# Patient Record
Sex: Male | Born: 1990 | Race: Black or African American | Hispanic: No | Marital: Single | Smoking: Never smoker
Health system: Southern US, Community
[De-identification: ages and names within clinical notes are randomized; demographics above are authoritative.]

## PROBLEM LIST (undated history)

## (undated) DIAGNOSIS — R569 Unspecified convulsions: Secondary | ICD-10-CM

## (undated) DIAGNOSIS — R45851 Suicidal ideations: Secondary | ICD-10-CM

## (undated) DIAGNOSIS — K521 Toxic gastroenteritis and colitis: Secondary | ICD-10-CM

## (undated) DIAGNOSIS — J45909 Unspecified asthma, uncomplicated: Secondary | ICD-10-CM

## (undated) DIAGNOSIS — F209 Schizophrenia, unspecified: Secondary | ICD-10-CM

## (undated) HISTORY — DX: Toxic gastroenteritis and colitis: K52.1

## (undated) HISTORY — DX: Suicidal ideations: R45.851

---

## 2017-05-15 ENCOUNTER — Encounter (HOSPITAL_COMMUNITY): Payer: Self-pay | Admitting: Emergency Medicine

## 2017-05-15 ENCOUNTER — Emergency Department (HOSPITAL_COMMUNITY)
Admission: EM | Admit: 2017-05-15 | Discharge: 2017-05-15 | Disposition: A | Discharge status: Home / Self Care | Payer: Self-pay | Attending: Emergency Medicine | Admitting: Emergency Medicine

## 2017-05-15 DIAGNOSIS — R110 Nausea: Secondary | ICD-10-CM | POA: Insufficient documentation

## 2017-05-15 DIAGNOSIS — R45851 Suicidal ideations: Secondary | ICD-10-CM | POA: Insufficient documentation

## 2017-05-15 DIAGNOSIS — Z008 Encounter for other general examination: Secondary | ICD-10-CM | POA: Insufficient documentation

## 2017-05-15 DIAGNOSIS — F209 Schizophrenia, unspecified: Secondary | ICD-10-CM | POA: Insufficient documentation

## 2017-05-15 DIAGNOSIS — Z9114 Patient's other noncompliance with medication regimen: Secondary | ICD-10-CM | POA: Insufficient documentation

## 2017-05-15 DIAGNOSIS — R197 Diarrhea, unspecified: Secondary | ICD-10-CM | POA: Insufficient documentation

## 2017-05-15 HISTORY — DX: Schizophrenia, unspecified: F20.9

## 2017-05-15 LAB — CBC WITH DIFFERENTIAL/PLATELET
Basophils Absolute: 0 10*3/uL (ref 0.0–0.1)
Basophils Relative: 0 %
Eosinophils Absolute: 0 10*3/uL (ref 0.0–0.7)
Eosinophils Relative: 0 %
HCT: 44 % (ref 39.0–52.0)
Hemoglobin: 14.7 g/dL (ref 13.0–17.0)
Lymphocytes Relative: 20 %
Lymphs Abs: 1.4 10*3/uL (ref 0.7–4.0)
MCH: 26.5 pg (ref 26.0–34.0)
MCHC: 33.4 g/dL (ref 30.0–36.0)
MCV: 79.4 fL (ref 78.0–100.0)
Monocytes Absolute: 0.7 10*3/uL (ref 0.1–1.0)
Monocytes Relative: 9 %
Neutro Abs: 4.9 10*3/uL (ref 1.7–7.7)
Neutrophils Relative %: 71 %
Platelets: 232 10*3/uL (ref 150–400)
RBC: 5.54 MIL/uL (ref 4.22–5.81)
RDW: 14.2 % (ref 11.5–15.5)
WBC: 7 10*3/uL (ref 4.0–10.5)

## 2017-05-15 LAB — COMPREHENSIVE METABOLIC PANEL
ALT: 30 U/L (ref 17–63)
AST: 21 U/L (ref 15–41)
Albumin: 4.4 g/dL (ref 3.5–5.0)
Alkaline Phosphatase: 74 U/L (ref 38–126)
Anion gap: 9 (ref 5–15)
BUN: 8 mg/dL (ref 6–20)
CO2: 25 mmol/L (ref 22–32)
Calcium: 9.2 mg/dL (ref 8.9–10.3)
Chloride: 106 mmol/L (ref 101–111)
Creatinine, Ser: 0.85 mg/dL (ref 0.61–1.24)
GFR calc Af Amer: >60 mL/min (ref >60)
GFR calc non Af Amer: >60 mL/min (ref >60)
Glucose, Bld: 98 mg/dL (ref 65–99)
Potassium: 3.4 mmol/L — ABNORMAL LOW (ref 3.5–5.1)
Sodium: 140 mmol/L (ref 135–145)
Total Bilirubin: 0.8 mg/dL (ref 0.3–1.2)
Total Protein: 7.8 g/dL (ref 6.5–8.1)

## 2017-05-15 LAB — RAPID URINE DRUG SCREEN, HOSP PERFORMED
Amphetamines: NOT DETECTED
Barbiturates: NOT DETECTED
Benzodiazepines: NOT DETECTED
Cocaine: NOT DETECTED
Opiates: NOT DETECTED
Tetrahydrocannabinol: NOT DETECTED

## 2017-05-15 LAB — TSH: TSH: 1.581 u[IU]/mL (ref 0.350–4.500)

## 2017-05-15 LAB — LITHIUM LEVEL: Lithium Lvl: <0.06 mmol/L — ABNORMAL LOW (ref 0.60–1.20)

## 2017-05-15 LAB — ETHANOL: Alcohol, Ethyl (B): <10 mg/dL (ref <10)

## 2017-05-15 LAB — VALPROIC ACID LEVEL: Valproic Acid Lvl: 30 ug/mL — ABNORMAL LOW (ref 50.0–100.0)

## 2017-05-15 NOTE — ED Provider Notes (Signed)
[facility]-EMERGENCY DEPT Provider Note   CSN: [id number] Arrival date & time: 05/15/17  1427     History   Chief Complaint Chief Complaint  Patient presents with  . IVC    HPI Dylan Flynn is a 27 y.o. male.  HPI  27 year old male with a history of schizophrenia presents [facility].  The patient has been having suicidal thoughts and worsening depression.  He has not been taking his meds for about 1 week.  He is currently on lithium, Depakote, and Clozaril.  [facility] sent him over here for lab work to be faxed back and they would then taken back.  This includes thyroid studies, lithium level, valproic acid level, CBC, CMP.  Patient states he has been having diarrhea starting a couple days ago that has improved.  He has had some abdominal discomfort that he describes as nausea without vomiting.  Currently no abdominal pain.  Is asking for something to drink.  Past Medical History:  Diagnosis Date  . Schizophrenia (HCC)     There are no active problems to display for this patient.     Home Medications    Prior to Admission medications   Not on File    Family History No family history on file.  Social History Social History   Tobacco Use  . Smoking status: Not on file  Substance Use Topics  . Alcohol use: Not on file  . Drug use: Not on file     Allergies   Patient has no allergy information on record.   Review of Systems Review of Systems  Constitutional: Negative for fever.  Gastrointestinal: Positive for diarrhea and nausea. Negative for abdominal pain and vomiting.  Psychiatric/Behavioral: Positive for dysphoric mood and suicidal ideas.  All other systems reviewed and are negative.    Physical Exam Updated Vital Signs BP 127/65 (BP Location: Left Arm)   Pulse 77   Temp 98.8 F (37.1 C) (Oral)   Resp 20   SpO2 100%   Physical Exam  Constitutional: He is oriented to person, place, and time. He  appears well-developed and well-nourished.  HENT:  Head: Normocephalic and atraumatic.  Right Ear: External ear normal.  Left Ear: External ear normal.  Nose: Nose normal.  Eyes: Right eye exhibits no discharge. Left eye exhibits no discharge.  Neck: Neck supple.  Cardiovascular: Normal rate, regular rhythm and normal heart sounds.  Pulmonary/Chest: Effort normal and breath sounds normal.  Abdominal: Soft. He exhibits no distension. There is no tenderness.  Musculoskeletal: He exhibits no edema.  Neurological: He is alert and oriented to person, place, and time.  Skin: Skin is warm and dry.  Psychiatric: His speech is delayed. He expresses suicidal ideation.  Nursing note and vitals reviewed.    ED Treatments / Results  Labs (all labs ordered are listed, but only abnormal results are displayed) Labs Reviewed  COMPREHENSIVE METABOLIC PANEL - Abnormal; Notable for the following components:      Result Value   Potassium 3.4 (*)    All other components within normal limits  LITHIUM LEVEL - Abnormal; Notable for the following components:   Lithium Lvl <0.06 (*)    All other components within normal limits  ETHANOL  RAPID URINE DRUG SCREEN, HOSP PERFORMED  CBC WITH DIFFERENTIAL/PLATELET  TSH  VALPROIC ACID LEVEL    EKG  EKG Interpretation None       Radiology No results found.  Procedures Procedures (including critical care time)  Medications  Ordered in ED Medications - No data to display   Initial Impression / Assessment and Plan / ED Course  I have reviewed the triage vital signs and the nursing notes.  Pertinent labs & imaging results that were available during my care of the patient were reviewed by me and considered in my medical decision making (see chart for details).     Patient's vital signs are unremarkable.  His exam is benign.  He appears stable for psychiatric admission and his labs are currently pending.  Care to Dr. Rubin Payor, transfer back to  [facility] when labs have resulted assuming no significant abnormalities.  Final Clinical Impressions(s) / ED Diagnoses   Final diagnoses:  Medical clearance for psychiatric admission    ED Discharge Orders    None       Pricilla Loveless, MD 05/15/17 [phone]

## 2017-05-15 NOTE — ED Provider Notes (Signed)
  Physical Exam  BP 127/65 (BP Location: Left Arm)   Pulse 77   Temp 98.8 F (37.1 C) (Oral)   Resp 20   SpO2 100%   Physical Exam  ED Course/Procedures     Procedures  MDM  Medical clearance labs done for [facility].  Patient appears to be medically cleared.  Well-appearing.  Discharge in police custody       Benjiman Coreickering, Sharie Amorin, MD 05/15/17 1744

## 2017-05-15 NOTE — ED Triage Notes (Signed)
Per [employer]/[facility]-per [facility]-grandmother took papers out on patient due to noncompliance with psych medication-needs labs for medical clearance and may return to [facility]-states paranoid and hearing voices-denies SI/HI

## 2017-05-15 NOTE — ED Notes (Signed)
[facility]chr[facility] [employer] [facility]

## 2017-07-23 ENCOUNTER — Encounter (HOSPITAL_COMMUNITY): Payer: Self-pay

## 2017-07-23 ENCOUNTER — Other Ambulatory Visit: Payer: Self-pay

## 2017-07-23 ENCOUNTER — Ambulatory Visit (HOSPITAL_COMMUNITY)
Admission: EM | Admit: 2017-07-23 | Discharge: 2017-07-23 | Disposition: A | Discharge status: Home / Self Care | Payer: Self-pay

## 2017-07-23 DIAGNOSIS — L02412 Cutaneous abscess of left axilla: Secondary | ICD-10-CM

## 2017-07-23 MED ORDER — CEPHALEXIN 500 MG PO CAPS: 500.0000 mg | ORAL_CAPSULE | Freq: Four times a day (QID) | ORAL | 0 refills | Status: DC

## 2017-07-23 NOTE — ED Provider Notes (Signed)
07/23/2017 3:30 PM   DOB: 01/01/1991 / MRN: [id number]  SUBJECTIVE:  Lesli AlbeeShon Devaul is a 27 y.o. male presenting for "abscess" under the right arm.  Patient has a history of schizophrenia and is taking clozapine.  He is here with his mother today.  Patient denies fever.  He has No Known Allergies.   He  has a past medical history of Schizophrenia (HCC).    He  reports that he has never smoked. He has never used smokeless tobacco. He reports that he does not drink alcohol or use drugs. He  reports that he does not engage in sexual activity. The patient  has no past surgical history on file.  His family history is not on file.  Review of Systems  Constitutional: Negative for chills, diaphoresis and fever.  Gastrointestinal: Negative for nausea.  Skin: Negative for rash.  Neurological: Negative for dizziness.    OBJECTIVE:  BP 124/75 (BP Location: Left Arm)   Pulse 98   Temp 98.8 F (37.1 C) (Oral)   Resp 17   SpO2 99%   Physical Exam  Constitutional: He appears well-developed. He is active and cooperative.  Non-toxic appearance.  Cardiovascular: Normal rate.  Pulmonary/Chest: Effort normal. No tachypnea.  Neurological: He is alert.  Skin: Skin is warm and dry. He is not diaphoretic. No pallor.     Vitals reviewed.   No results found for this or any previous visit (from the past 72 hour(s)).  No results found.  ASSESSMENT AND PLAN:  No orders of the defined types were placed in this encounter.    Abscess of left axilla: I have gone through the options today.  I think it would probably benefit from incision and drainage however do not think that is absolutely necessary at this time.  Relayed this to the family and they would like to try antibiotics and will come back if at any point this seems to be getting worse.  I have advised copious warm compress.      The patient is advised to call or return to clinic if he does not see an improvement in symptoms, or to seek the  care of the closest emergency department if he worsens with the above plan.   Deliah BostonMichael Ayiana Winslett, MHS, PA-C 07/23/2017 3:30 PM    Ofilia Neaslark, Darriel Utter L, PA-C 07/23/17 1531

## 2017-07-23 NOTE — ED Triage Notes (Signed)
[facility] several months

## 2017-07-23 NOTE — Discharge Instructions (Addendum)
Please place a warm compress on the area often.  This will help it drain faster.  If at any point things seem to be getting worse please come back here for incision and drainage.

## 2017-10-27 ENCOUNTER — Ambulatory Visit (HOSPITAL_COMMUNITY)
Admission: EM | Admit: 2017-10-27 | Discharge: 2017-10-27 | Disposition: A | Discharge status: Home / Self Care | Payer: Self-pay | Attending: Family Medicine | Admitting: Family Medicine

## 2017-10-27 ENCOUNTER — Other Ambulatory Visit: Payer: Self-pay

## 2017-10-27 ENCOUNTER — Encounter (HOSPITAL_COMMUNITY): Payer: Self-pay | Admitting: *Deleted

## 2017-10-27 DIAGNOSIS — L02411 Cutaneous abscess of right axilla: Secondary | ICD-10-CM

## 2017-10-27 MED ORDER — DOXYCYCLINE HYCLATE 100 MG PO CAPS: 100.0000 mg | ORAL_CAPSULE | Freq: Two times a day (BID) | ORAL | 0 refills | Status: DC

## 2017-10-27 NOTE — ED Provider Notes (Signed)
[facility]   [id number] 10/27/17 Arrival Time: 1844  SUBJECTIVE:  Greg Cratty is a 27 y.o. male who presents with a abscess localized to his right axilla that occurred gradually a week ago.  Denies a precipitating event or trauma.  Has tried warm compresses with mild relief.  Reports similar symptoms in the past that improved with antibiotics.  Complains of redness, swelling, and drainage.  Denies fever, chills, nausea, vomiting, SOB, chest pain, abdominal pain, changes in bowel or bladder function.    ROS: As per HPI.  Past Medical History:  Diagnosis Date  . Schizophrenia (HCC)    History reviewed. No pertinent surgical history. Allergies  Allergen Reactions  . Shellfish Allergy    No current facility-administered medications on file prior to encounter.    Current Outpatient Medications on File Prior to Encounter  Medication Sig Dispense Refill  . cloZAPine (CLOZARIL) 100 MG tablet Take 150 mg by mouth 2 (two) times daily.    . Divalproex Sodium (DEPAKOTE PO) Take 10 mLs by mouth daily.    Marland Kitchen lithium 600 MG capsule Take 600 mg by mouth daily.    Marland Kitchen UNABLE TO FIND Med Name: ?Vybrid     Social History   Socioeconomic History  . Marital status: Single    Spouse name: Not on file  . Number of children: Not on file  . Years of education: Not on file  . Highest education level: Not on file  Occupational History  . Not on file  Social Needs  . Financial resource strain: Not on file  . Food insecurity:    Worry: Not on file    Inability: Not on file  . Transportation needs:    Medical: Not on file    Non-medical: Not on file  Tobacco Use  . Smoking status: Never Smoker  . Smokeless tobacco: Never Used  Substance and Sexual Activity  . Alcohol use: Never    Frequency: Never  . Drug use: Never  . Sexual activity: Never  Lifestyle  . Physical activity:    Days per week: Not on file    Minutes per session: Not on file  . Stress: Not on file  Relationships   . Social connections:    Talks on phone: Not on file    Gets together: Not on file    Attends religious service: Not on file    Active member of club or organization: Not on file    Attends meetings of clubs or organizations: Not on file    Relationship status: Not on file  . Intimate partner violence:    Fear of current or ex partner: Not on file    Emotionally abused: Not on file    Physically abused: Not on file    Forced sexual activity: Not on file  Other Topics Concern  . Not on file  Social History Narrative  . Not on file   Family History  Problem Relation Age of Onset  . Diabetes Mother     OBJECTIVE: Vitals:   10/27/17 1856  BP: 122/83  Pulse: 96  Resp: 18  Temp: 98.2 F (36.8 C)  TempSrc: Oral  SpO2: 97%    General appearance: alert; no distress Lungs: clear to auscultation bilaterally Heart: regular rate and rhythm.  Radial pulse 2+ bilaterally Extremities: no edema Skin: warm and dry; approximately 4cm with abscess localized to the right axilla with active purulent drainage and surrounding erythema; 2 cm of surrounding induration Psychological: alert and cooperative;  normal mood and affect  ASSESSMENT & PLAN:  1. Abscess of right axilla     Meds ordered this encounter  Medications  . doxycycline (VIBRAMYCIN) 100 MG capsule    Sig: Take 1 capsule (100 mg total) by mouth 2 (two) times daily.    Dispense:  20 capsule    Refill:  0    Order Specific Question:   Supervising Provider    Answer:   Isa RankinMURRAY, LAURA WILSON [[id number]]    Apply warm compresses 3-4x daily for 10-15 minutes Wash site daily with warm water and mild soap Keep covered to avoid friction Take antibiotic as prescribed and to completion Follow up here or with PCP if symptoms persists Return or go to the ED if you have any new or worsening symptoms Reviewed expectations re: course of current medical issues. Questions answered.  Outlined signs and symptoms indicating need for more  acute intervention. Patient verbalized understanding. After Visit Summary given.   Rennis HardingWurst, Jerrod Damiano, PA-C 10/27/17 1942

## 2018-06-09 ENCOUNTER — Ambulatory Visit (HOSPITAL_COMMUNITY)
Admission: EM | Admit: 2018-06-09 | Discharge: 2018-06-09 | Disposition: A | Discharge status: Home / Self Care | Payer: Self-pay | Attending: Family Medicine | Admitting: Family Medicine

## 2018-06-09 ENCOUNTER — Encounter (HOSPITAL_COMMUNITY): Payer: Self-pay | Admitting: *Deleted

## 2018-06-09 DIAGNOSIS — Z23 Encounter for immunization: Secondary | ICD-10-CM

## 2018-06-09 DIAGNOSIS — S91115A Laceration without foreign body of left lesser toe(s) without damage to nail, initial encounter: Secondary | ICD-10-CM

## 2018-06-09 DIAGNOSIS — W108XXA Fall (on) (from) other stairs and steps, initial encounter: Secondary | ICD-10-CM

## 2018-06-09 HISTORY — DX: Unspecified convulsions: R56.9

## 2018-06-09 MED ORDER — LIDOCAINE-EPINEPHRINE-TETRACAINE (LET) SOLUTION
NASAL | Status: AC
  Filled 2018-06-09: qty 3

## 2018-06-09 MED ORDER — TETANUS-DIPHTH-ACELL PERTUSSIS 5-2.5-18.5 LF-MCG/0.5 IM SUSP
INTRAMUSCULAR | Status: AC
  Filled 2018-06-09: qty 0.5

## 2018-06-09 MED ORDER — LIDOCAINE-EPINEPHRINE-TETRACAINE (LET) SOLUTION
3.0000 mL | Freq: Once | NASAL | Status: AC
  Administered 2018-06-09: 3 mL via TOPICAL

## 2018-06-09 MED ORDER — TETANUS-DIPHTH-ACELL PERTUSSIS 5-2.5-18.5 LF-MCG/0.5 IM SUSP
0.5000 mL | Freq: Once | INTRAMUSCULAR | Status: AC
  Administered 2018-06-09: 0.5 mL via INTRAMUSCULAR

## 2018-06-09 MED ORDER — CEPHALEXIN 500 MG PO CAPS: 500.0000 mg | ORAL_CAPSULE | Freq: Four times a day (QID) | ORAL | 0 refills | Status: DC

## 2018-06-09 NOTE — ED Provider Notes (Signed)
[facility]    CSN: [id number] Arrival date & time: 06/09/18  1702     History   Chief Complaint Chief Complaint  Patient presents with  . Extremity Laceration    HPI Dylan Flynn is a 28 y.o. male.   28 year old male comes in for laceration to the left 5th toe. States he tripped and fell down the stairs. Denies head injury, loss of consciousness. States he cleaned up the wound and came in for evaluation.  Denies numbness/tingling of the toe. Denies painful weightbearing. Has not taken anything for the symptoms. Requesting tetanus as last tetanus 12 years ago.      Past Medical History:  Diagnosis Date  . Schizophrenia (HCC)   . Seizures (HCC)     There are no active problems to display for this patient.   History reviewed. No pertinent surgical history.     Home Medications    Prior to Admission medications   Medication Sig Start Date End Date Taking? Authorizing Provider  cloZAPine (CLOZARIL) 100 MG tablet Take 150 mg by mouth 2 (two) times daily.   Yes [provider]  Divalproex Sodium (DEPAKOTE PO) Take 10 mLs by mouth daily.   Yes [provider]  lithium 600 MG capsule Take 600 mg by mouth daily.   Yes [provider]  Vilazodone HCl (VIIBRYD PO) Take by mouth.   Yes [provider]  cephALEXin (KEFLEX) 500 MG capsule Take 1 capsule (500 mg total) by mouth 4 (four) times daily. 06/09/18   Belinda Fisher, PA-C  UNABLE TO FIND Med Name: ?Vybrid    [provider]    Family History Family History  Problem Relation Age of Onset  . Diabetes Mother     Social History Social History   Tobacco Use  . Smoking status: Never Smoker  . Smokeless tobacco: Never Used  Substance Use Topics  . Alcohol use: Never    Frequency: Never  . Drug use: Never     Allergies   Other and Shellfish allergy   Review of Systems Review of Systems  Reason unable to perform ROS: See HPI as above.     Physical  Exam Triage Vital Signs ED Triage Vitals  Enc Vitals Group     BP 06/09/18 1817 121/74     Pulse Rate 06/09/18 1817 (!) 112     Resp 06/09/18 1817 20     Temp 06/09/18 1815 97.9 F (36.6 C)     Temp Source 06/09/18 1815 Temporal     SpO2 06/09/18 1817 96 %     Weight --      Height --      Head Circumference --      Peak Flow --      Pain Score 06/09/18 1816 2     Pain Loc --      Pain Edu? --      Excl. in GC? --    No data found.  Updated Vital Signs BP 121/74   Pulse (!) 112   Temp 97.9 F (36.6 C) (Temporal)   Resp 20   SpO2 96%   Physical Exam Constitutional:      General: He is not in acute distress.    Appearance: He is well-developed. He is not ill-appearing, toxic-appearing or diaphoretic.  HENT:     Head: Normocephalic and atraumatic.  Eyes:     Conjunctiva/sclera: Conjunctivae normal.     Pupils: Pupils are equal, round, and reactive  to light.  Musculoskeletal:     Comments: See picture below. Depth hard to determine given location of laceration. Bleeding controlled. No tenderness to palpation of toe. Full ROM of toe. Cap refill <2s   Neurological:     Mental Status: He is alert and oriented to person, place, and time.         UC Treatments / Results  Labs (all labs ordered are listed, but only abnormal results are displayed) Labs Reviewed - No data to display  EKG None  Radiology No results found.  Procedures Laceration Repair Date/Time: 06/09/2018 7:40 PM Performed by: Belinda FisherYu, Amy V, PA-C Authorized by: Eustace MooreNelson, Yvonne Sue, MD   Consent:    Consent obtained:  Verbal   Consent given by:  Patient   Risks discussed:  Infection, pain, poor cosmetic result, poor wound healing and need for additional repair   Alternatives discussed:  Delayed treatment and no treatment Anesthesia (see MAR for exact dosages):    Anesthesia method:  Topical application   Topical anesthetic:  LET Laceration details:    Location:  Toe   Toe location:  L little  toe   Length (cm):  3   Depth (mm):  5 Repair type:    Repair type:  Simple Pre-procedure details:    Preparation:  Patient was prepped and draped in usual sterile fashion Exploration:    Hemostasis achieved with:  LET   Wound exploration: wound explored through full range of motion and entire depth of wound probed and visualized   Treatment:    Area cleansed with:  Hibiclens   Amount of cleaning:  Extensive   Irrigation solution:  Sterile saline   Irrigation method:  Pressure wash and tap   Visualized foreign bodies/material removed: no   Skin repair:    Repair method:  Tissue adhesive Approximation:    Approximation:  Loose Post-procedure details:    Dressing:  Open (no dressing)   Patient tolerance of procedure:  Tolerated well, no immediate complications   (including critical care time)  Medications Ordered in UC Medications  lidocaine-EPINEPHrine-tetracaine (LET) solution (3 mLs Topical Given 06/09/18 1852)  Tdap (BOOSTRIX) injection 0.5 mL (0.5 mLs Intramuscular Given 06/09/18 1927)    Initial Impression / Assessment and Plan / UC Course  I have reviewed the triage vital signs and the nursing notes.  Pertinent labs & imaging results that were available during my care of the patient were reviewed by me and considered in my medical decision making (see chart for details).     Given location of laceration, unable to repair with stiches. Discussed using dermabond for sterile dressing and allow area to heal on own. Risks and benefits discussed. Patient expresses understanding and agrees to use dermabond.  Patient tolerated procedure well. Will put patient on keflex given location of laceration. Wound care instructions given. Crutches provided. Return precautions given. Patient expresses understanding and agrees to plan.  Final Clinical Impressions(s) / UC Diagnoses   Final diagnoses:  Laceration of lesser toe of left foot without foreign body present or damage to nail,  initial encounter    ED Prescriptions    Medication Sig Dispense Auth. Provider   cephALEXin (KEFLEX) 500 MG capsule Take 1 capsule (500 mg total) by mouth 4 (four) times daily. 28 capsule Threasa AlphaYu, Amy V, PA-C        Yu, Amy V, [place]PA-C 06/09/18 1942

## 2018-06-09 NOTE — ED Triage Notes (Signed)
Pt reports tripping and falling down stairs @ approx 1500 today.  Denies any LOC or hitting head.  Reports laceration to left 5th toe.

## 2018-06-09 NOTE — Discharge Instructions (Addendum)
Tetanus updated today. Start keflex as directed to cover for infection. As discussed, buddy tape toes and use crutches to prevent reopening wound. The glue will fall off on own once wound is healed. Monitor for spreading redness, warmth, fever, follow up for reevaluation needed.

## 2018-09-13 ENCOUNTER — Ambulatory Visit (INDEPENDENT_AMBULATORY_CARE_PROVIDER_SITE_OTHER): Payer: Medicaid Other

## 2018-09-13 ENCOUNTER — Other Ambulatory Visit: Payer: Self-pay

## 2018-09-13 ENCOUNTER — Ambulatory Visit (HOSPITAL_COMMUNITY)
Admission: EM | Admit: 2018-09-13 | Discharge: 2018-09-13 | Disposition: A | Discharge status: Home / Self Care | Payer: Medicaid Other | Attending: Internal Medicine | Admitting: Internal Medicine

## 2018-09-13 DIAGNOSIS — J209 Acute bronchitis, unspecified: Secondary | ICD-10-CM

## 2018-09-13 MED ORDER — ALBUTEROL SULFATE HFA 108 (90 BASE) MCG/ACT IN AERS: 1.0000 | INHALATION_SPRAY | Freq: Four times a day (QID) | RESPIRATORY_TRACT | 0 refills | Status: DC | PRN

## 2018-09-13 MED ORDER — PREDNISONE 20 MG PO TABS: 20.0000 mg | ORAL_TABLET | Freq: Every day | ORAL | 0 refills | Status: AC

## 2018-09-13 NOTE — ED Triage Notes (Signed)
Per pt he has been having a nagging cough for about 1 week and has been taking Mucinex. Not much relief. No chills, no sore throat, no fevers, no nasal drainage.

## 2018-09-13 NOTE — ED Provider Notes (Addendum)
[facility]    CSN: [id number] Arrival date & time: 09/13/18  1833     History   Chief Complaint Chief Complaint  Patient presents with  . Cough    HPI Dylan Flynn is a 28 y.o. male with a history of schizophrenia comes to urgent care with complaints of shortness of breath, cough with yellow sputum of 1 week duration.  Patient says symptoms started insidiously and is been getting progressively worse.  She denied any chest pain or chest pressure.  She admits to having occasional wheezing.  No tobacco use.  No sick contacts.  He denies any runny nose or postnasal drip.  No aggravating or relieving factors.  Patient complains of diarrhea which is unchanged and he attributes it to Viibryd   HPI  Past Medical History:  Diagnosis Date  . Schizophrenia (HCC)   . Seizures (HCC)     There are no active problems to display for this patient.   No past surgical history on file.     Home Medications    Prior to Admission medications   Medication Sig Start Date End Date Taking? Authorizing Provider  cephALEXin (KEFLEX) 500 MG capsule Take 1 capsule (500 mg total) by mouth 4 (four) times daily. 06/09/18   Cathie HoopsYu, Amy V, PA-C  cloZAPine (CLOZARIL) 100 MG tablet Take 150 mg by mouth 2 (two) times daily.    [provider]  Divalproex Sodium (DEPAKOTE PO) Take 10 mLs by mouth daily.    [provider]  lithium 600 MG capsule Take 600 mg by mouth daily.    [provider]  UNABLE TO FIND Med Name: ?Vybrid    [provider]  Vilazodone HCl (VIIBRYD PO) Take by mouth.    [provider]    Family History Family History  Problem Relation Age of Onset  . Diabetes Mother     Social History Social History   Tobacco Use  . Smoking status: Never Smoker  . Smokeless tobacco: Never Used  Substance Use Topics  . Alcohol use: Never    Frequency: Never  . Drug use: Never     Allergies   Other and Shellfish allergy   Review of  Systems Review of Systems  Constitutional: Negative for activity change and appetite change.  HENT: Positive for congestion. Negative for postnasal drip, rhinorrhea, sinus pressure, sinus pain, sore throat and voice change.   Eyes: Negative.   Respiratory: Positive for cough, chest tightness and wheezing. Negative for shortness of breath.   Cardiovascular: Negative for chest pain and palpitations.  Gastrointestinal: Negative.   Genitourinary: Negative.  Negative for dysuria, frequency and urgency.  Musculoskeletal: Negative.   Skin: Negative.   Neurological: Negative for dizziness, syncope and headaches.  Psychiatric/Behavioral: Negative for agitation, decreased concentration and dysphoric mood.  All other systems reviewed and are negative.    Physical Exam Triage Vital Signs ED Triage Vitals  Enc Vitals Group     BP 09/13/18 1919 108/76     Pulse Rate 09/13/18 1919 (!) 103     Resp 09/13/18 1919 16     Temp 09/13/18 1919 98.6 F (37 C)     Temp Source 09/13/18 1919 Oral     SpO2 09/13/18 1919 94 %     Weight --      Height --      Head Circumference --      Peak Flow --      Pain Score 09/13/18 1922 0  Pain Loc --      Pain Edu? --      Excl. in GC? --    No data found.  Updated Vital Signs BP 108/76 (BP Location: Right Arm)   Pulse (!) 103   Temp 98.6 F (37 C) (Oral)   Resp 16   SpO2 94%   Visual Acuity Right Eye Distance:   Left Eye Distance:   Bilateral Distance:    Right Eye Near:   Left Eye Near:    Bilateral Near:     Physical Exam Constitutional:      General: He is not in acute distress.    Appearance: Normal appearance. He is not ill-appearing.  HENT:     Right Ear: Tympanic membrane normal.     Left Ear: Tympanic membrane normal.  Neck:     Musculoskeletal: Normal range of motion.  Cardiovascular:     Rate and Rhythm: Normal rate and regular rhythm.     Pulses: Normal pulses.     Heart sounds: Normal heart sounds.  Pulmonary:      Effort: Respiratory distress present.     Breath sounds: No stridor. Wheezing and rhonchi present. No rales.  Chest:     Chest wall: No tenderness.  Abdominal:     General: Bowel sounds are normal.     Palpations: Abdomen is soft.  Musculoskeletal: Normal range of motion.        General: No swelling or deformity.  Skin:    General: Skin is warm.     Capillary Refill: Capillary refill takes less than 2 seconds.     Coloration: Skin is not jaundiced.     Findings: No bruising.  Neurological:     General: No focal deficit present.     Mental Status: He is alert and oriented to person, place, and time.  Psychiatric:        Mood and Affect: Mood normal.      UC Treatments / Results  Labs (all labs ordered are listed, but only abnormal results are displayed) Labs Reviewed - No data to display  EKG None  Radiology No results found.  Procedures Procedures (including critical care time)  Medications Ordered in UC Medications - No data to display  Initial Impression / Assessment and Plan / UC Course  I have reviewed the triage vital signs and the nursing notes.  Pertinent labs & imaging results that were available during my care of the patient were reviewed by me and considered in my medical decision making (see chart for details).    1.  Acute bronchitis with bronchospasm: Prednisone 20 mg orally for 5 days Albuterol inhaler Chest x-ray is negative for acute lung infiltrate.  Final Clinical Impressions(s) / UC Diagnoses   Final diagnoses:  None   Discharge Instructions   None    ED Prescriptions    None     Controlled Substance Prescriptions [place] Controlled Substance Registry consulted? No   Merrilee Jansky, MD 09/13/18 1954    Merrilee Jansky, MD 09/13/18 2009

## 2018-10-06 ENCOUNTER — Other Ambulatory Visit: Payer: Self-pay

## 2018-10-06 ENCOUNTER — Ambulatory Visit (HOSPITAL_COMMUNITY)
Admission: EM | Admit: 2018-10-06 | Discharge: 2018-10-06 | Disposition: A | Discharge status: Home / Self Care | Payer: Medicaid Other | Attending: Family Medicine | Admitting: Family Medicine

## 2018-10-06 ENCOUNTER — Encounter (HOSPITAL_COMMUNITY): Payer: Self-pay | Admitting: Family Medicine

## 2018-10-06 DIAGNOSIS — J4520 Mild intermittent asthma, uncomplicated: Secondary | ICD-10-CM | POA: Diagnosis not present

## 2018-10-06 HISTORY — DX: Unspecified asthma, uncomplicated: J45.909

## 2018-10-06 MED ORDER — ALBUTEROL SULFATE HFA 108 (90 BASE) MCG/ACT IN AERS: 1.0000 | INHALATION_SPRAY | Freq: Four times a day (QID) | RESPIRATORY_TRACT | 11 refills | Status: DC | PRN

## 2018-10-06 MED ORDER — ALBUTEROL SULFATE HFA 108 (90 BASE) MCG/ACT IN AERS: 1.0000 | INHALATION_SPRAY | Freq: Four times a day (QID) | RESPIRATORY_TRACT | 11 refills | Status: AC | PRN

## 2018-10-06 NOTE — Discharge Instructions (Addendum)
Your inhaler now has a year's worth of refills.

## 2018-10-06 NOTE — ED Triage Notes (Signed)
Pt states he needs a albuterol inhaler refill for a year. Pt has been out for a very while.

## 2018-10-06 NOTE — ED Provider Notes (Signed)
[facility]    CSN: [id number] Arrival date & time: 10/06/18  1706     History   Chief Complaint Chief Complaint  Patient presents with  . Medication Refill    HPI Dylan Flynn is a 28 y.o. male.   Established [facility] patient, 28 yo male  Patient presents for refill on inhaler.  His current inhaler is working well, but he ran out.  No fever or significant cough.  No shortness of breath.   Note from 09/13/18:   Dylan Flynn is a 28 y.o. male with a history of schizophrenia comes to urgent care with complaints of shortness of breath, cough with yellow sputum of 1 week duration.  Patient says symptoms started insidiously and is been getting progressively worse.  She denied any chest pain or chest pressure.  She admits to having occasional wheezing.  No tobacco use.  No sick contacts.  He denies any runny nose or postnasal drip.  No aggravating or relieving factors.  Patient complains of diarrhea which is unchanged and he attributes it to [employer]ibryd      Past Medical History:  Diagnosis Date  . Asthma   . Schizophrenia (HCC)   . Seizures (HCC)     There are no active problems to display for this patient.   History reviewed. No pertinent surgical history.     Home Medications    Prior to Admission medications   Medication Sig Start Date End Date Taking? Authorizing Provider  albuterol (VENTOLIN HFA) 108 (90 Base) MCG/ACT inhaler Inhale 1-2 puffs into the lungs every 6 (six) hours as needed for wheezing or shortness of breath. 10/06/18   Elvina SidleLauenstein, Shuntay Everetts, MD  cloZAPine (CLOZARIL) 100 MG tablet Take 150 mg by mouth 2 (two) times daily.    [provider]  Divalproex Sodium (DEPAKOTE PO) Take 10 mLs by mouth daily.    [provider]  lithium 600 MG capsule Take 600 mg by mouth daily.    [provider]  UNABLE TO FIND Med Name: ?Vybrid    [provider]  Vilazodone HCl (VIIBRYD PO) Take by mouth.    [provider]     Family History Family History  Problem Relation Age of Onset  . Diabetes Mother     Social History Social History   Tobacco Use  . Smoking status: Never Smoker  . Smokeless tobacco: Never Used  Substance Use Topics  . Alcohol use: Never    Frequency: Never  . Drug use: Never     Allergies   Other and Shellfish allergy   Review of Systems Review of Systems  Respiratory: Positive for wheezing.   All other systems reviewed and are negative.    Physical Exam Triage Vital Signs ED Triage Vitals  Enc Vitals Group     BP      Pulse      Resp      Temp      Temp src      SpO2      Weight      Height      Head Circumference      Peak Flow      Pain Score      Pain Loc      Pain Edu?      Excl. in GC?    No data found.  Updated Vital Signs BP (!) 144/84 (BP Location: Right Arm)   Pulse (!) 106   Temp 98.2 F (36.8 C) (Oral)  Resp 18   Wt 136.1 kg   SpO2 98%    Physical Exam Vitals signs and nursing note reviewed.  Constitutional:      Appearance: Normal appearance. He is obese.  HENT:     Head: Normocephalic.     Right Ear: External ear normal.     Nose: Nose normal.     Mouth/Throat:     Mouth: Mucous membranes are moist.  Eyes:     Conjunctiva/sclera: Conjunctivae normal.  Neck:     Musculoskeletal: Normal range of motion and neck supple.  Cardiovascular:     Rate and Rhythm: Normal rate.  Pulmonary:     Effort: Pulmonary effort is normal.     Breath sounds: Normal breath sounds.  Musculoskeletal: Normal range of motion.  Skin:    General: Skin is warm and dry.  Neurological:     General: No focal deficit present.     Mental Status: He is alert.  Psychiatric:        Mood and Affect: Mood normal.      UC Treatments / Results  Labs (all labs ordered are listed, but only abnormal results are displayed) Labs Reviewed - No data to display  EKG None  Radiology No results found.  Procedures Procedures (including critical care  time)  Medications Ordered in UC Medications - No data to display  Initial Impression / Assessment and Plan / UC Course  I have reviewed the triage vital signs and the nursing notes.  Pertinent labs & imaging results that were available during my care of the patient were reviewed by me and considered in my medical decision making (see chart for details).    Final Clinical Impressions(s) / UC Diagnoses   Final diagnoses:  Mild intermittent asthma, unspecified whether complicated     Discharge Instructions     Your inhaler now has a year's worth of refills.    ED Prescriptions    Medication Sig Dispense Auth. Provider   albuterol (VENTOLIN HFA) 108 (90 Base) MCG/ACT inhaler Inhale 1-2 puffs into the lungs every 6 (six) hours as needed for wheezing or shortness of breath. [place], MD     Controlled Substance Prescriptions [place] Controlled Substance Registry consulted? Not Applicable   Robyn Haber, MD 10/06/18 1759

## 2018-11-06 ENCOUNTER — Ambulatory Visit (INDEPENDENT_AMBULATORY_CARE_PROVIDER_SITE_OTHER): Payer: Medicaid Other | Admitting: Family Medicine

## 2018-11-06 ENCOUNTER — Other Ambulatory Visit: Payer: Self-pay

## 2018-11-06 ENCOUNTER — Encounter: Payer: Self-pay | Admitting: Family Medicine

## 2018-11-06 DIAGNOSIS — F209 Schizophrenia, unspecified: Secondary | ICD-10-CM | POA: Insufficient documentation

## 2018-11-06 DIAGNOSIS — J4520 Mild intermittent asthma, uncomplicated: Secondary | ICD-10-CM | POA: Diagnosis present

## 2018-11-06 DIAGNOSIS — K521 Toxic gastroenteritis and colitis: Secondary | ICD-10-CM | POA: Diagnosis not present

## 2018-11-06 DIAGNOSIS — J45909 Unspecified asthma, uncomplicated: Secondary | ICD-10-CM | POA: Insufficient documentation

## 2018-11-06 MED ORDER — LOPERAMIDE HCL 2 MG PO CAPS: 2.0000 mg | ORAL_CAPSULE | ORAL | 0 refills | Status: DC | PRN

## 2018-11-06 NOTE — Assessment & Plan Note (Signed)
Diarrhea is a common side effect of Viibryd.  Recommended PRN loperamide for his diarrhea.  Sent a prescription but told him and his mother that they can also get it over-the-counter if that is cheaper.

## 2018-11-06 NOTE — Patient Instructions (Addendum)
It was nice meeting you today [name]!  Please take your albuterol inhaler only when you feel short of breath, although you can take it before exercise if you need it as well.  If you are taking your albuterol every day at times other than during exercise, please call me and let me know, and I can send a maintenance inhaler medication that can keep you from feeling a short of breath.  You can try Imodium (also known as loperamide) for your diarrhea.  Only take this as you need it, you do not need to take it if you are not having diarrhea.  Please come and see me as needed or in a year, whichever comes first.  If you have any questions or concerns, please feel free to call the clinic.   Be well,  Dr. Shan Levans

## 2018-11-06 NOTE — Assessment & Plan Note (Signed)
Patient was counseled on the proper use of albuterol, including only using it as needed.  The technique for inhaler use was also reviewed with the patient, and it seems that he understands this well.  While evidence has shown that and as needed inhaled corticosteroid is more effective than albuterol, because his asthma is mild and he is already been prescribed an inhaler, we will continue with the albuterol for now.  He will let me know if he is needing to use his albuterol for shortness of breath once per day or more since we may need to add a maintenance inhaler at that point.

## 2018-11-06 NOTE — Assessment & Plan Note (Signed)
Managed by [facility].  Since patient says that he gets labs from [facility], we will not check white count, BMP, lithium level, and valproic acid level today, but I told the patient that if they do stop checking his labs, I will be happy to check them.

## 2018-11-06 NOTE — Progress Notes (Signed)
Subjective:    Dylan Flynn - 28 y.o. male MRN [id number]  Date of birth: 10/13/1990  CC:  Dylan Flynn is here to establish care.  He is accompanied by his mother.  He would like to discuss management of his asthma and his intermittent diarrhea.  HPI:  Asthma management He was recently seen at urgent care, where an albuterol inhaler was prescribed to him.  He says that he has been using it multiple times per day not because he is short of breath but because he thought that that was how he was supposed to use the inhaler.  He also will use it prior to exercise.  He gets short of breath about a few times per month normally.  Intermittent diarrhea He says that his Viibryd makes him have loose stools right after eating.  He has not struggled with nausea, vomiting, or abdominal pain.  He is tolerating all of his medications well except for this side effect.  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  Meds: Albuterol PRN, clozaril, viibryd, depakote, lithium  FH:  T2DM, HTN  Surgical Hx:  None  Social Hx:  Lives with his mom and grandma.  Denies cigarette, alcohol, and other substance use.  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  Health Maintenance: Declines HIV screening today. There are no preventive care reminders to display for this patient.  -  reports that he has never smoked. He has never used smokeless tobacco.   Review of Systems  Constitutional: Negative for chills, fever and malaise/fatigue.  Respiratory: Negative for cough, shortness of breath and wheezing.   Cardiovascular: Negative for chest pain and palpitations.  Gastrointestinal: Positive for diarrhea. Negative for abdominal pain, nausea and vomiting.  Musculoskeletal: Negative for myalgias.  Neurological:  Negative for headaches.  Psychiatric/Behavioral: Negative for depression. The patient is not nervous/anxious.     - Past Medical History: Patient Active Problem List   Diagnosis Date Noted  . Asthma   . Schizophrenia (HCC)   . Diarrhea due to drug    - Medications: reviewed and updated   Objective:   Physical Exam BP 126/82   Pulse 95   Wt (!) 331 lb 3.2 oz (150.2 kg)   SpO2 97%  Gen: NAD, alert, cooperative with exam, well-appearing, pleasant, obese HEENT: NCAT, PERRL, clear conjunctiva, oropharynx clear, supple neck CV: RRR, good S1/S2, no murmur, no edema Resp: CTABL, no wheezes, non-labored Abd: SNTND, BS present, no guarding or organomegaly Skin: no rashes, normal turgor  Neuro: no gross deficits.  Psych: Normal mood and slightly flat affect  PHQ 2: Score of 0    Assessment & Plan:   Asthma Patient was counseled on the proper use of albuterol, including only using it as needed.  The technique for inhaler use was also reviewed with the patient, and it seems that he understands this well.  While evidence has shown that and as needed inhaled corticosteroid is more effective than albuterol, because his asthma is mild and he is already been prescribed an inhaler, we will continue with the albuterol for now.  He will let me know if he is needing to use his albuterol for shortness of breath once per day or more since we may need to add a maintenance inhaler at that point.  Schizophrenia (HCC) [facility] by [facility].  Since patient says that he gets labs from [facility], we will not check white count, BMP, lithium level, and valproic acid level today, but I told the patient that if they do stop checking his labs, I will be happy to check them.  Diarrhea due to drug Diarrhea is a common side effect of Viibryd.  Recommended PRN loperamide for his diarrhea.  Sent a prescription but told him and his mother that they can also get it over-the-counter if that is cheaper.    Maia Breslow,  M.D. 11/06/2018, 1:23 PM PGY-3, [facility]

## 2018-12-04 ENCOUNTER — Other Ambulatory Visit: Payer: Self-pay | Admitting: Family Medicine

## 2018-12-12 ENCOUNTER — Other Ambulatory Visit: Payer: Self-pay

## 2018-12-12 ENCOUNTER — Ambulatory Visit: Payer: Medicaid Other | Admitting: Family Medicine

## 2018-12-12 ENCOUNTER — Encounter: Payer: Self-pay | Admitting: Family Medicine

## 2018-12-12 VITALS — BP 118/76 | HR 94 | Wt 326.4 lb

## 2018-12-12 DIAGNOSIS — L03811 Cellulitis of head [any part, except face]: Secondary | ICD-10-CM

## 2018-12-12 MED ORDER — CEPHALEXIN 500 MG PO CAPS: 500.0000 mg | ORAL_CAPSULE | Freq: Two times a day (BID) | ORAL | 0 refills | Status: AC

## 2018-12-12 NOTE — Progress Notes (Signed)
   Subjective:    Dylan Flynn - 28 y.o. male MRN [id number]  Date of birth: 06-13-1990  CC:  Dylan Flynn is here for a lesion on his scalp.  HPI: Skin lesion Has been present for the past 1 to 2 weeks Located on the back of patient's head Patient has expressed some blood from the lesion, but it has not had any other drainage Denies surrounding swelling or pain Denies constitutional symptoms including fever, chills, reduced appetite Has had some lesions in his bilateral axilla and on his neck before that he describes as pimples.  He thinks these are similar to what he currently has.  Health Maintenance:  Health Maintenance Due  Topic Date Due  . INFLUENZA VACCINE  12/01/2018    -  reports that he has never smoked. He has never used smokeless tobacco. - Review of Systems: Per HPI. - Past Medical History: Patient Active Problem List   Diagnosis Date Noted  . Cellulitis of head except face 12/13/2018  . Asthma   . Schizophrenia ([place])   . Diarrhea due to drug    - Medications: reviewed and updated   Objective:   Physical Exam BP 118/76   Pulse 94   Wt (!) 326 lb 6.4 oz (148.1 kg)   SpO2 96%  Gen: NAD, alert, cooperative with exam, well-appearing, obese, pleasant HEENT: No cervical or occipital lymphadenopathy Skin: Skin lesion on the back of patient's scalp under his hair is obscured from vision due to dried, crusted drainage.  Nontender to palpation.  No fluctuance.    Assessment & Plan:   Cellulitis of head except face It is difficult to characterize this lesion due to obstruction by hair and crusted drainage.  It is similar in appearance to a kerion, but patient is a bit old for this and has no other signs of tinea.  Due to the amount of drainage this has had, will treat as cellulitis.  Will prescribe Keflex 500 mg twice daily for 7 days.  Patient was counseled that this will likely resolve with antibiotics some time, but he can continue cleaning the area with shampoo  daily.  He was told that if this lesion does not improve with antibiotics, he should give it a few days and then return to our clinic for reevaluation.    Maia Breslow, M.D. 12/13/2018, 9:39 AM PGY-3, [place]

## 2018-12-12 NOTE — Patient Instructions (Addendum)
It was nice seeing you today Dylan Flynn!  I am sending in an antibiotic called Keflex for your skin.  Please take this twice daily for 7 days.  Please let us know if your skin does not improve after taking this antibiotic.  You can continue washing this area daily with your normal shampoo.  If you have any questions or concerns, please feel free to call the clinic.   Be well,  Dr. Shan Levans

## 2018-12-13 DIAGNOSIS — L03811 Cellulitis of head [any part, except face]: Secondary | ICD-10-CM | POA: Insufficient documentation

## 2018-12-13 NOTE — Assessment & Plan Note (Addendum)
It is difficult to characterize this lesion due to obstruction by hair and crusted drainage.  It is similar in appearance to a kerion, but patient is a bit old for this and has no other signs of tinea.  Due to the amount of drainage this has had, will treat as cellulitis.  Will prescribe Keflex 500 mg twice daily for 7 days.  Patient was counseled that this will likely resolve with antibiotics some time, but he can continue cleaning the area with shampoo daily.  He was told that if this lesion does not improve with antibiotics, he should give it a few days and then return to our clinic for reevaluation.

## 2019-01-05 ENCOUNTER — Other Ambulatory Visit: Payer: Self-pay | Admitting: Family Medicine

## 2019-02-04 ENCOUNTER — Other Ambulatory Visit: Payer: Self-pay

## 2019-02-04 ENCOUNTER — Ambulatory Visit (INDEPENDENT_AMBULATORY_CARE_PROVIDER_SITE_OTHER): Payer: Medicaid Other | Admitting: Family Medicine

## 2019-02-04 DIAGNOSIS — L304 Erythema intertrigo: Secondary | ICD-10-CM

## 2019-02-04 DIAGNOSIS — Z0000 Encounter for general adult medical examination without abnormal findings: Secondary | ICD-10-CM

## 2019-02-04 DIAGNOSIS — Z23 Encounter for immunization: Secondary | ICD-10-CM

## 2019-02-04 HISTORY — DX: Erythema intertrigo: L30.4

## 2019-02-04 HISTORY — DX: Encounter for general adult medical examination without abnormal findings: Z00.00

## 2019-02-04 MED ORDER — KETOCONAZOLE 2 % EX CREA: 1.0000 "application " | TOPICAL_CREAM | Freq: Every day | CUTANEOUS | 1 refills | Status: DC

## 2019-02-04 NOTE — Patient Instructions (Addendum)
It was great meeting you today!  All of your itching is due to a fungal skin infection.  This is very common for this area of the body as it can routinely trap dead skin, sweat, dust, dirt.  I have attached some resources to help prevent this in the future.  Also treated with a ketoconazole cream 1%, apply to the area once daily for 2 weeks.  If you need more than the one tube I have sent in a refill just in case you need it.  Intertrigo Intertrigo is skin irritation (inflammation) that happens in warm, moist areas of the body. The irritation can cause a rash and make skin raw and itchy. The rash is usually pink or red. It happens mostly between folds of skin or where skin rubs together, such as:  Between the toes.  In the armpits.  In the groin area.  Under the belly.  Under the breasts.  Around the butt area. This condition is not passed from person to person (is not contagious). What are the causes?  Heat, moisture, rubbing, and not enough air movement.  The condition can be made worse by: ? Sweat. ? Bacteria. ? A fungus, such as yeast. What increases the risk?  Moisture in your skin folds.  You are more likely to develop this condition if you: ? Have diabetes. ? Are overweight. ? Are not able to move around. ? Live in a warm and moist climate. ? Wear splints, braces, or other medical devices. ? Are not able to control your pee (urine) or poop (stool). What are the signs or symptoms?  A pink or red skin rash in the skin fold or near the skin fold.  Raw or scaly skin.  Itching.  A burning feeling.  Bleeding.  Leaking fluid.  A bad smell. How is this treated?  Cleaning and drying your skin.  Taking an antibiotic medicine or using an antibiotic skin cream for a bacterial infection.  Using an antifungal cream on your skin or taking pills for an infection that was caused by a fungus, such as yeast.  Using a steroid ointment to stop the itching and irritation.   Separating the skin fold with a clean cotton cloth to absorb moisture and allow air to flow into the area. Follow these instructions at home:  Keep the affected area clean and dry.  Do not scratch your skin.  Stay cool as much as you can. Use an air conditioner or a fan, if you have one.  Apply over-the-counter and prescription medicines only as told by your doctor.  If you were prescribed an antibiotic medicine, use it as told by your doctor. Do not stop using the antibiotic even if your condition starts to get better.  Keep all follow-up visits as told by your doctor. This is important. How is this prevented?   Stay at a healthy weight.  Take care of your feet. This is very important if you have diabetes. You should: ? Wear shoes that fit well. ? Keep your feet dry. ? Wear clean cotton or wool socks.  Protect the skin in your groin and butt area as told by your doctor. To do this: ? Follow a regular cleaning routine. ? Use creams, powders, or ointments that protect your skin. ? Change protection pads often.  Do not wear tight clothes. Wear clothes that: ? Are loose. ? Take moisture away from your body. ? Are made of cotton.  Wear a bra that gives good  support, if needed.  Shower and dry yourself well after being active. Use a hair dryer on a cool setting to dry between skin folds.  Keep your blood sugar under control if you have diabetes. Contact a doctor if:  Your symptoms do not get better with treatment.  Your symptoms get worse or they spread.  You notice more redness and warmth.  You have a fever. Summary  Intertrigo is skin irritation that occurs when folds of skin rub together.  This condition is caused by heat, moisture, and rubbing.  This condition may be treated by cleaning and drying your skin and with medicines.  Apply over-the-counter and prescription medicines only as told by your doctor.  Keep all follow-up visits as told by your doctor.  This is important. This information is not intended to replace advice given to you by your health care provider. Make sure you discuss any questions you have with your health care provider. Document Released: 05/21/2010 Document Revised: 01/25/2018 Document Reviewed: 01/25/2018 Elsevier Patient Education  2020 [employer].

## 2019-02-04 NOTE — Progress Notes (Signed)
   HPI 28 year old male who presents for inguinal and genitourinary itching.  He states is been going on for over a year.  It comes and goes, but when it is present he cannot stop itching.  He has not tried thing for it as he thought this was normal, but decided to get it checked out.  Says that he is a "heavy sweater".  He is having no fevers, no chills  CC: Itching in groin area   ROS:  Review of Systems See HPI for ROS.   CC, SH/smoking status, and VS noted  Objective: BP 124/80   Pulse 97   Temp 99.3 F (37.4 C) (Axillary)   Wt (!) 337 lb (152.9 kg)   SpO2 19%  Gen: 28 year old African-American male, no acute distress, resting comfortably CV: Skin warm and dry Resp: No accessory muscle use Abd: Soft, nontender, nondistended Neuro: Alert and oriented, Speech clear, No gross deficits GU: Erythema with mild excoriation noted bilateral inguinal crease and and scrotal area.   Assessment and plan:  Intertrigo Patient's GU itching consistent with intertrigo.  Patient with few skin folds in the GU area that would be a good nidus for this.  Will treat with ketoconazole 1% once a day for 2 weeks.  Gave instructions on keeping area dry, clean carefully prevent in the future.  Briefly counseled on weight loss.  Healthcare maintenance Flu vaccine received today   Orders Placed This Encounter  Procedures  . Flu Vaccine QUAD 36+ mos IM    Meds ordered this encounter  Medications  . ketoconazole (NIZORAL) 2 % cream    Sig: Apply 1 application topically daily.    Dispense:  15 g    Refill:  1   Guadalupe Dawn MD PGY-3 Family Medicine Resident  02/04/2019 10:41 AM

## 2019-02-04 NOTE — Assessment & Plan Note (Signed)
Patient's GU itching consistent with intertrigo.  Patient with few skin folds in the GU area that would be a good nidus for this.  Will treat with ketoconazole 1% once a day for 2 weeks.  Gave instructions on keeping area dry, clean carefully prevent in the future.  Briefly counseled on weight loss.

## 2019-02-04 NOTE — Assessment & Plan Note (Signed)
Flu vaccine received today 

## 2019-02-11 ENCOUNTER — Other Ambulatory Visit: Payer: Self-pay | Admitting: Family Medicine

## 2019-03-13 ENCOUNTER — Other Ambulatory Visit: Payer: Self-pay | Admitting: Family Medicine

## 2019-04-08 ENCOUNTER — Other Ambulatory Visit: Payer: Self-pay | Admitting: Family Medicine

## 2019-05-14 ENCOUNTER — Other Ambulatory Visit: Payer: Self-pay

## 2019-05-14 MED ORDER — LOPERAMIDE HCL 2 MG PO CAPS: ORAL_CAPSULE | ORAL | 0 refills | Status: DC

## 2019-05-16 ENCOUNTER — Other Ambulatory Visit: Payer: Self-pay | Admitting: Family Medicine

## 2019-06-26 ENCOUNTER — Other Ambulatory Visit: Payer: Self-pay | Admitting: Family Medicine

## 2019-07-19 ENCOUNTER — Ambulatory Visit: Payer: Medicare HMO | Attending: Internal Medicine

## 2019-07-19 DIAGNOSIS — Z23 Encounter for immunization: Secondary | ICD-10-CM

## 2019-07-19 NOTE — Progress Notes (Signed)
   Covid-19 Vaccination Clinic  Name:  Dylan Flynn    MRN: [id number] DOB: 03-31-1991  07/19/2019  [name] was observed post Covid-19 immunization for 30 minutes based on pre-vaccination screening without incident. He was provided with Vaccine Information Sheet and instruction to access the V-Safe system.   [name] was instructed to call 911 with any severe reactions post vaccine: Marland Kitchen Difficulty breathing  . Swelling of face and throat  . A fast heartbeat  . A bad rash all over body  . Dizziness and weakness   Immunizations Administered    Name Date Dose VIS Date Route   Pfizer COVID-19 Vaccine 07/19/2019  9:39 AM 0.3 mL 04/12/2019 Intramuscular   Manufacturer: [employer], [employer]   Lot: [id number]   NDC: [address]-4

## 2019-07-29 ENCOUNTER — Other Ambulatory Visit: Payer: Self-pay

## 2019-07-29 MED ORDER — LOPERAMIDE HCL 2 MG PO CAPS: ORAL_CAPSULE | ORAL | 0 refills | Status: DC

## 2019-08-13 ENCOUNTER — Ambulatory Visit: Payer: Medicare HMO | Attending: Internal Medicine

## 2019-08-13 DIAGNOSIS — Z23 Encounter for immunization: Secondary | ICD-10-CM

## 2019-08-13 NOTE — Progress Notes (Signed)
   Covid-19 Vaccination Clinic  Name:  Jayvier Burgher    MRN: [id number] DOB: 1990/09/02  08/13/2019  Mr. Trimmer was observed post Covid-19 immunization for 15 minutes without incident. He was provided with Vaccine Information Sheet and instruction to access the V-Safe system.   Mr. Freel was instructed to call 911 with any severe reactions post vaccine: Marland Kitchen Difficulty breathing  . Swelling of face and throat  . A fast heartbeat  . A bad rash all over body  . Dizziness and weakness   Immunizations Administered    Name Date Dose VIS Date Route   Pfizer COVID-19 Vaccine 08/13/2019  4:35 PM 0.3 mL 04/12/2019 Intramuscular   Manufacturer: [employer], [employer]   Lot: [id number]   NDC: [address]-6

## 2019-08-21 ENCOUNTER — Other Ambulatory Visit: Payer: Self-pay | Admitting: Family Medicine

## 2019-08-26 ENCOUNTER — Ambulatory Visit (INDEPENDENT_AMBULATORY_CARE_PROVIDER_SITE_OTHER): Payer: Medicare HMO | Admitting: Family Medicine

## 2019-08-26 ENCOUNTER — Other Ambulatory Visit: Payer: Self-pay

## 2019-08-26 ENCOUNTER — Encounter: Payer: Self-pay | Admitting: Family Medicine

## 2019-08-26 VITALS — BP 145/80 | HR 110 | Temp 97.9°F | Wt 340.0 lb

## 2019-08-26 DIAGNOSIS — R221 Localized swelling, mass and lump, neck: Secondary | ICD-10-CM

## 2019-08-26 HISTORY — DX: Localized swelling, mass and lump, neck: R22.1

## 2019-08-26 NOTE — Patient Instructions (Signed)
Hi Trigger,  It was lovely to meet you today! Today we took a look at your the neck lump which you have had for a few months. We did a bedside ultrasound that showed a small amount of fluid in it. It could be a fibroadenoma or an abnormality of the thyroid gland. It is reassuring that you do not have any other symptoms. I have referred you to ENT. If you do not here from them in the next few weeks please let me know.   Best wishes and take care,  Dr Allena Katz

## 2019-08-26 NOTE — Progress Notes (Signed)
    SUBJECTIVE:   CHIEF COMPLAINT / HPI:   Dmonte Maher is a 29 yr old male who presents today for a neck lump. Mother also present   Lump on neck Noticed a 5cm X 1 cm oblong shaped lump over neck a few months ago. Is unsure whether it is getting bigger but is concerned about what it is. Denies cuts/surgeries/trauma to the area. Denies pain,purulent discharge, dysphagia or previous thyroid problems, weight loss, night sweats or fevers.  PERTINENT  PMH / PSH: Asthma, schizophrenia   OBJECTIVE:   BP (!) 145/80   Pulse (!) 110   Temp 97.9 F (36.6 C) (Oral)   Wt (!) 340 lb (154.2 kg)   SpO2 97%    General: Alert, pleasant no acute distress HEENT: 1cm X 5cm oblong firm, subdermal mass with well demarcated edged, non fluctuant, non mobile, not trans-illuminable.  Cardio: Normal S1 and S2, RRR Pulm:  CTAB, normal WOB    ASSESSMENT/PLAN:   Localized swelling, mass and lump, neck Midline neck lump with unclear etiology. Likely benign mass such as fibroadenoma/fibroma. Considered thyroid goitre however less likely given no hx of thyroid disorders and no new symptoms indicated thyroid disorder with presentation of mass. Considered thyroglossal cyst as midline but this is more common in a younger age group and would be soft, round and small. Considered keloid scar and hypertrophic scar due to clinical similarities, age group and hyperpigmented skin however no trauma/surgery so less likely. Performed bedside US today with Dr Manson Passey, both thyroid glands visualized. No evidence of goitre. Lump over midline visualized with hypoechoic density within. -Referred to ENT for further evaluation -Reassured patient and mother that this is a likely benign mass     Towanda Octave, MD [facility]

## 2019-08-26 NOTE — Assessment & Plan Note (Addendum)
Midline neck lump with unclear etiology. Likely benign mass such as fibroadenoma/fibroma. Considered thyroid goitre however less likely given no hx of thyroid disorders and no new symptoms indicated thyroid disorder with presentation of mass. Considered thyroglossal cyst as midline but this is more common in a younger age group and would be soft, round and small. Considered keloid scar and hypertrophic scar due to clinical similarities, age group and hyperpigmented skin however no trauma/surgery so less likely. Performed bedside US today with Dr Manson Passey, both thyroid glands visualized. No evidence of goitre. Lump over midline visualized with hypoechoic density within. -Referred to ENT for further evaluation -Reassured patient and mother that this is a likely benign mass

## 2019-09-21 ENCOUNTER — Emergency Department (HOSPITAL_COMMUNITY)
Admission: EM | Admit: 2019-09-21 | Discharge: 2019-09-23 | Disposition: A | Discharge status: Home / Self Care | Payer: Medicare HMO | Attending: Emergency Medicine | Admitting: Emergency Medicine

## 2019-09-21 ENCOUNTER — Other Ambulatory Visit: Payer: Self-pay

## 2019-09-21 DIAGNOSIS — Z20822 Contact with and (suspected) exposure to covid-19: Secondary | ICD-10-CM | POA: Insufficient documentation

## 2019-09-21 DIAGNOSIS — R45851 Suicidal ideations: Secondary | ICD-10-CM | POA: Diagnosis not present

## 2019-09-21 DIAGNOSIS — F209 Schizophrenia, unspecified: Secondary | ICD-10-CM | POA: Diagnosis not present

## 2019-09-21 DIAGNOSIS — Z79899 Other long term (current) drug therapy: Secondary | ICD-10-CM | POA: Insufficient documentation

## 2019-09-21 DIAGNOSIS — J45909 Unspecified asthma, uncomplicated: Secondary | ICD-10-CM | POA: Diagnosis not present

## 2019-09-21 DIAGNOSIS — R440 Auditory hallucinations: Secondary | ICD-10-CM | POA: Insufficient documentation

## 2019-09-21 DIAGNOSIS — Z046 Encounter for general psychiatric examination, requested by authority: Secondary | ICD-10-CM | POA: Diagnosis present

## 2019-09-21 LAB — VALPROIC ACID LEVEL: Valproic Acid Lvl: 24 ug/mL — ABNORMAL LOW (ref 50.0–100.0)

## 2019-09-21 LAB — CBC WITH DIFFERENTIAL/PLATELET
Abs Immature Granulocytes: 0.05 10*3/uL (ref 0.00–0.07)
Basophils Absolute: 0 10*3/uL (ref 0.0–0.1)
Basophils Relative: 0 %
Eosinophils Absolute: 0.2 10*3/uL (ref 0.0–0.5)
Eosinophils Relative: 2 %
HCT: 44.8 % (ref 39.0–52.0)
Hemoglobin: 13.8 g/dL (ref 13.0–17.0)
Immature Granulocytes: 1 %
Lymphocytes Relative: 19 %
Lymphs Abs: 1.8 10*3/uL (ref 0.7–4.0)
MCH: 24.7 pg — ABNORMAL LOW (ref 26.0–34.0)
MCHC: 30.8 g/dL (ref 30.0–36.0)
MCV: 80.3 fL (ref 80.0–100.0)
Monocytes Absolute: 0.6 10*3/uL (ref 0.1–1.0)
Monocytes Relative: 6 %
Neutro Abs: 6.9 10*3/uL (ref 1.7–7.7)
Neutrophils Relative %: 72 %
Platelets: 255 10*3/uL (ref 150–400)
RBC: 5.58 MIL/uL (ref 4.22–5.81)
RDW: 15.8 % — ABNORMAL HIGH (ref 11.5–15.5)
WBC: 9.6 10*3/uL (ref 4.0–10.5)
nRBC: 0 % (ref 0.0–0.2)

## 2019-09-21 LAB — COMPREHENSIVE METABOLIC PANEL
ALT: 90 U/L — ABNORMAL HIGH (ref 0–44)
AST: 40 U/L (ref 15–41)
Albumin: 3.8 g/dL (ref 3.5–5.0)
Alkaline Phosphatase: 146 U/L — ABNORMAL HIGH (ref 38–126)
Anion gap: 7 (ref 5–15)
BUN: 12 mg/dL (ref 6–20)
CO2: 28 mmol/L (ref 22–32)
Calcium: 9.2 mg/dL (ref 8.9–10.3)
Chloride: 106 mmol/L (ref 98–111)
Creatinine, Ser: 1.05 mg/dL (ref 0.61–1.24)
GFR calc Af Amer: >60 mL/min (ref >60)
GFR calc non Af Amer: >60 mL/min (ref >60)
Glucose, Bld: 127 mg/dL — ABNORMAL HIGH (ref 70–99)
Potassium: 4.2 mmol/L (ref 3.5–5.1)
Sodium: 141 mmol/L (ref 135–145)
Total Bilirubin: 0.5 mg/dL (ref 0.3–1.2)
Total Protein: 7.9 g/dL (ref 6.5–8.1)

## 2019-09-21 LAB — RAPID URINE DRUG SCREEN, HOSP PERFORMED
Amphetamines: NOT DETECTED
Barbiturates: NOT DETECTED
Benzodiazepines: NOT DETECTED
Cocaine: NOT DETECTED
Opiates: NOT DETECTED
Tetrahydrocannabinol: NOT DETECTED

## 2019-09-21 LAB — SARS CORONAVIRUS 2 BY RT PCR (HOSPITAL ORDER, PERFORMED IN ~~LOC~~ HOSPITAL LAB): SARS Coronavirus 2: NEGATIVE

## 2019-09-21 LAB — ETHANOL: Alcohol, Ethyl (B): <10 mg/dL (ref <10)

## 2019-09-21 LAB — LITHIUM LEVEL: Lithium Lvl: 0.29 mmol/L — ABNORMAL LOW (ref 0.60–1.20)

## 2019-09-21 MED ORDER — VALPROIC ACID 250 MG/5ML PO SOLN
500.0000 mg | Freq: Every day | ORAL | Status: DC
  Administered 2019-09-21 – 2019-09-23 (×3): 500 mg via ORAL
  Filled 2019-09-21 (×3): qty 10

## 2019-09-21 MED ORDER — CLOZAPINE 25 MG PO TABS
150.0000 mg | ORAL_TABLET | Freq: Two times a day (BID) | ORAL | Status: DC
  Administered 2019-09-21 – 2019-09-23 (×5): 150 mg via ORAL
  Filled 2019-09-21 (×7): qty 2

## 2019-09-21 MED ORDER — ACETAMINOPHEN 325 MG PO TABS: 650.0000 mg | ORAL_TABLET | ORAL | Status: DC | PRN

## 2019-09-21 MED ORDER — ALBUTEROL SULFATE (2.5 MG/3ML) 0.083% IN NEBU: 3.0000 mL | INHALATION_SOLUTION | Freq: Four times a day (QID) | RESPIRATORY_TRACT | Status: DC | PRN

## 2019-09-21 MED ORDER — ALUM & MAG HYDROXIDE-SIMETH 200-200-20 MG/5ML PO SUSP: 30.0000 mL | Freq: Four times a day (QID) | ORAL | Status: DC | PRN

## 2019-09-21 MED ORDER — LITHIUM CARBONATE 300 MG PO CAPS
600.0000 mg | ORAL_CAPSULE | Freq: Every day | ORAL | Status: DC
  Administered 2019-09-21 – 2019-09-23 (×3): 600 mg via ORAL
  Filled 2019-09-21 (×3): qty 2

## 2019-09-21 NOTE — ED Provider Notes (Signed)
[facility] DEPT Provider Note   CSN: [id number] Arrival date & time: 09/21/19  5188     History Chief Complaint  Patient presents with  . Medical Clearance    Dylan Flynn is a 29 y.o. male with a past medical history significant for asthma, schizophrenia, and seizures who presents to the ED by [facility] from [facility] after allegedly becoming agitated and jumping on the bed. Patient IVC'd by [employer]. During my initial evaluation patient, denies SI, HI, auditory and visual hallucinations. He notes he was diagnosed with schizophrenia and has been compliant with his medication (clozapine); however, has not taken it this morning yet. Denies alcohol, tobacco, and drug use. Per triage note, patient admitted to hearing voices, but denies this during my initial evaluation. Very difficult to obtain HPI because patient denied everything and said "i'm good" after every question. Denies any physical complaints at this time.   Patient IVC'd by [employer]. Per IVC paperwork "individual became aggressive and violent while staying at the crisis center.  He was kicking doors and walls and threatening staff.  This occurred without warning.  He has a history of responding to internal stimuli and he insisted that there were multiple people "trying to get him" as he became increasingly aggressive and threatening the facility staff."  History obtained from patient and past medical records. No interpreter used during encounter.      Past Medical History:  Diagnosis Date  . Asthma   . Schizophrenia ([place])   . Seizures [facility])     Patient Active Problem List   Diagnosis Date Noted  . Localized swelling, mass and lump, neck 08/26/2019  . Intertrigo 02/04/2019  . Healthcare maintenance 02/04/2019  . Cellulitis of head except face 12/13/2018  . Asthma   . Schizophrenia ([place])   . Diarrhea due to drug     No past surgical history on file.     Family History  Problem Relation Age of  Onset  . Diabetes Mother     Social History   Tobacco Use  . Smoking status: Never Smoker  . Smokeless tobacco: Never Used  Substance Use Topics  . Alcohol use: Never  . Drug use: Never    Home Medications Prior to Admission medications   Medication Sig Start Date End Date Taking? Authorizing Provider  albuterol (VENTOLIN HFA) 108 (90 Base) MCG/ACT inhaler Inhale 1-2 puffs into the lungs every 6 (six) hours as needed for wheezing or shortness of breath. 10/06/18  Yes Vanessa Kick, MD  cloZAPine (CLOZARIL) 100 MG tablet Take 150 mg by mouth 2 (two) times daily.   Yes [provider]  Divalproex Sodium (DEPAKOTE PO) Take 10 mLs by mouth daily.   Yes [provider]  lithium 600 MG capsule Take 600 mg by mouth daily.   Yes [provider]  loperamide (IMODIUM) 2 MG capsule TAKE 1 CAPSULE BY MOUTH AS NEEDED FOR FOR DIARRHEA OR LOOSE STOOLS. 08/21/19  Yes Winfrey, Alcario Drought, MD  valproic acid (DEPAKENE) 250 MG/5ML solution Take 10 mLs by mouth daily. 08/21/19  Yes [provider]  Vilazodone HCl (VIIBRYD PO) Take by mouth.   Yes [provider]  ketoconazole (NIZORAL) 2 % cream APPLY EXTERNALLY TO THE AFFECTED AREA DAILY Patient not taking: No sig reported 05/16/19   Kathrene Alu, MD    Allergies    Other and Shellfish allergy  Review of Systems   Review of Systems  Constitutional: Negative for chills and fever.  HENT: Negative  for rhinorrhea and sore throat.   Eyes: Negative for visual disturbance.  Respiratory: Negative for shortness of breath.   Cardiovascular: Negative for chest pain and palpitations.  Gastrointestinal: Negative for abdominal pain.  Genitourinary: Negative for dysuria.  Musculoskeletal: Negative for myalgias.  Skin: Negative for color change and rash.  Neurological: Negative for dizziness and light-headedness.  Psychiatric/Behavioral: Positive for agitation and hallucinations (per triage note).  All other systems  reviewed and are negative.   Physical Exam Updated Vital Signs BP (!) 149/83 (BP Location: Left Arm)   Pulse (!) 111   Temp 99 F (37.2 C)   Resp 20   Ht 5\' 8"  (1.727 m)   SpO2 94%   BMI 51.70 kg/m   Physical Exam Vitals and nursing note reviewed.  Constitutional:      General: He is not in acute distress.    Appearance: He is not ill-appearing.     Comments: Resting comfortably in bed  HENT:     Head: Normocephalic.  Eyes:     Pupils: Pupils are equal, round, and reactive to light.  Cardiovascular:     Rate and Rhythm: Regular rhythm. Tachycardia present.     Pulses: Normal pulses.     Heart sounds: Normal heart sounds. No murmur. No friction rub. No gallop.   Pulmonary:     Effort: Pulmonary effort is normal.     Breath sounds: Normal breath sounds.  Abdominal:     General: Abdomen is flat. There is no distension.     Palpations: Abdomen is soft.     Tenderness: There is no abdominal tenderness. There is no guarding or rebound.  Musculoskeletal:     Cervical back: Neck supple.     Comments: Able to move all 4 extremities without difficulty. No lower extremity edema or calf tenderness.   Skin:    General: Skin is warm and dry.  Neurological:     General: No focal deficit present.  Psychiatric:        Attention and Perception: He perceives auditory hallucinations.        Thought Content: Thought content does not include homicidal or suicidal ideation.     ED Results / Procedures / Treatments   Labs (all labs ordered are listed, but only abnormal results are displayed) Labs Reviewed  COMPREHENSIVE METABOLIC PANEL - Abnormal; Notable for the following components:      Result Value   Glucose, Bld 127 (*)    ALT 90 (*)    Alkaline Phosphatase 146 (*)    All other components within normal limits  CBC WITH DIFFERENTIAL/PLATELET - Abnormal; Notable for the following components:   MCH 24.7 (*)    RDW 15.8 (*)    All other components within normal limits    VALPROIC ACID LEVEL - Abnormal; Notable for the following components:   Valproic Acid Lvl 24 (*)    All other components within normal limits  LITHIUM LEVEL - Abnormal; Notable for the following components:   Lithium Lvl 0.29 (*)    All other components within normal limits  SARS CORONAVIRUS 2 BY RT PCR (HOSPITAL ORDER, PERFORMED IN [facility])  ETHANOL  RAPID URINE DRUG SCREEN, HOSP PERFORMED  CBC WITH DIFFERENTIAL/PLATELET  RAPID URINE DRUG SCREEN, HOSP PERFORMED    EKG EKG Interpretation  Date/Time:  Saturday Sep 21 2019 09:55:05 EDT Ventricular Rate:  106 PR Interval:  118 QRS Duration: 98 QT Interval:  328 QTC Calculation: 435 R Axis:   72 Text  Interpretation: Sinus tachycardia Nonspecific T wave abnormality Abnormal ECG No old tracing to compare Confirmed by Meridee Score [phone]) on 09/21/2019 10:02:59 AM   Radiology No results found.  Procedures Procedures (including critical care time)  Medications Ordered in ED Medications  valproic acid (DEPAKENE) 250 MG/5ML solution 500 mg (has no administration in time range)  albuterol (PROVENTIL) (2.5 MG/3ML) 0.083% nebulizer solution 3 mL (has no administration in time range)  lithium carbonate capsule 600 mg (has no administration in time range)  cloZAPine (CLOZARIL) tablet 150 mg (has no administration in time range)  acetaminophen (TYLENOL) tablet 650 mg (has no administration in time range)  alum & mag hydroxide-simeth (MAALOX/MYLANTA) 200-200-20 MG/5ML suspension 30 mL (has no administration in time range)    ED Course  I have reviewed the triage vital signs and the nursing notes.  Pertinent labs & imaging results that were available during my care of the patient were reviewed by me and considered in my medical decision making (see chart for details).  Clinical Course as of Sep 20 1416  Sat Sep 21, 2019  2636 29 year old male history of schizophrenia sent in from [facility] for evaluation of increased  delusional behavior.  Patient has no medical complaints.  Getting medically screened and then TTS evaluation.  IVC was taken out by [facility] so we will do first exam.   [MB]  1414 Valproic Acid,S(!): 24 [CA]  1415 Lithium(!): 0.29 [CA]    Clinical Course User Index [CA] Mannie Stabile, PA-C [MB] Terrilee Files, MD   MDM Rules/Calculators/A&P                     29 year old male with a history of schizophrenia presents to the ED via [facility] from [facility] due to agitation. He admitted to auditory hallucinations to triage, but denied them to me during my initial evaluation. Patient compliant with schizophrenia medication. Chart reviewed. Do not see past admissions to [facility]. Patient tachycardic at arrival which has been improving, will continue to monitor. No exam findings suggestive over pathological reason for tachycardia. Will obtain medical clearance labs. Patient IVC'd by [employer]. First examination paperwork filled out after initial evaluation.   CBC reassuring with no leukocytosis.  CMP reassuring with normal renal function no major electrolyte derangements.  Normal ethanol level.  UDS negative.  Covid test negative.  Patient has been medically cleared for TTS evaluation.  Reviewed TTS evaluation who recommends inpatient admission. Patient placed in psych hold. Home medications ordered.   The patient has been placed in psychiatric observation due to the need to provide a safe environment for the patient while obtaining psychiatric consultation and evaluation, as well as ongoing medical and medication management to treat the patient's condition.  The patient has been placed under full IVC at this time.  Discussed case with Dr. Charm Barges who evaluated patient at bedside and agrees with assessment and plan.  Final Clinical Impression(s) / ED Diagnoses Final diagnoses:  Auditory hallucination  Schizophrenia, unspecified type [facility])    Rx / DC Orders ED Discharge Orders    None         Jesusita Oka 09/21/19 1707    Terrilee Files, MD 09/21/19 1757

## 2019-09-21 NOTE — Consult Note (Signed)
  Patient assessed by TTS counselor. Inpatient psychiatric treatment recommended once medically cleared. Restarted home medications including depakene 61ml daily, Clozaril 150 mg BID, and Lithium 600mg  daily.

## 2019-09-21 NOTE — ED Triage Notes (Signed)
Patient brought in by the police from [employer]. Per [employer] staff states patient was agitated and jumping on bed, but when police arrived he was in bed and has been cooperative. Patient is hearing voices. Pt states he had an altercation with people he knows.

## 2019-09-21 NOTE — Progress Notes (Signed)
CSW completed [facility] application and faxed it over for review along with clinical documentation to support referral.  Edwin Dada, MSW, LCSW-A Transitions of Care  Clinical Social Worker  [facility] Emergency Departments  Medical ICU [phone]

## 2019-09-21 NOTE — Progress Notes (Signed)
09/21/2019  1010  Labs drawn. Light green, dark green, purple, red, and gold top tubes all sent to main lab.

## 2019-09-21 NOTE — BH Assessment (Signed)
Assessment Note  Dylan Flynn is an 29 y.o. male that presents with IVC from [facility]. Per IVC patient became violent and aggressive while at the crisis center. He was kicking doors and walls while threatening staff. He has a history of responding to internal stimuli and he insisted this date that multiple people "were trying to get him" as he became aggressive. Patient renders limited history on arrival and appears guarded. Patient continues to be responding to internal stimuli as evidenced by staff RN reporting that patient was asking her if "she heard that" while she was conducting nursing assessment. Patient appears preoccupied as this Clinical research associate attempts to assess and cannot recall his treatment history. Patient is observed to stop mid sentence as this Clinical research associate conducts assessment several times and is difficult to redirect. Patient will put his fingers in his ears and state "wow" and then say "what's that." Patient states he is currently receiving medications from a "MD I think" although cannot recall what medications he is currently prescribed or where he obtains those medications. Patient reports multiple attempts at self harm although again renders limited history in reference to treatment dates or details of those episodes. Patient denies any SA history with UDS pending this date. Patient appears to be preoccupied and presents with a flat affect. This writer attempted to contact patient's mother Dylan Flynn [phone] to gather collateral information. Staff RN reports written has been obtained from patient. Mother could not be reached at the time of assessment.   Per EDP admission notes this date. Patient presents with a past medical history significant for asthma, schizophrenia, and seizures who presents to the ED by [facility] from [facility] after allegedly becoming agitated and jumping on the bed. Patient IVC'd by [employer]. During my initial evaluation patient, denies SI, HI, auditory and visual hallucinations. He  notes he was diagnosed with schizophrenia and has been compliant with his medication (clozapine); however, has not taken it this morning yet. Denies alcohol, tobacco, and drug use. Per triage note, patient admitted to hearing voices, but denies this during my initial evaluation. Very difficult to obtain HPI because patient denied everything and said "I'm good" after every question. Denies any physical complaints at this time.   Per history review patient was seen in 2019 from [facility] for medical clearance after family reported increased AVH associated with medication non compliance.   Patient is covered by a blanket and oriented x4. Patient is observed to be preoccupied and is experiencing AH as noted above. Patient's affect is flat as patient speaks in a low soft voice. Motor behavior appears normal. Eye contact is poor. Thought process is disorganized and patient renders limited history. Case was staffed with Arlana Pouch NP who recommended a inpatient admission to assist with stabilization.    Diagnosis: F29.0 Schizophrenia (per notes)   Past Medical History:  Past Medical History:  Diagnosis Date  . Asthma   . Schizophrenia ([facility])   . Seizures ([facility])     No past surgical history on file.  Family History:  Family History  Problem Relation Age of Onset  . Diabetes Mother     Social History:  reports that he has never smoked. He has never used smokeless tobacco. He reports that he does not drink alcohol or use drugs.  Additional Social History:  Alcohol / Drug Use Pain Medications: See MAR Prescriptions: See MAR Over the Counter: See MAR History of alcohol / drug use?: No history of alcohol / drug abuse(Patient denies)  CIWA: CIWA-Ar BP: (!) 149/83  Pulse Rate: (!) 111 COWS:    Allergies:  Allergies  Allergen Reactions  . Other     Tree nuts  . Shellfish Allergy     Home Medications: (Not in a hospital admission)   OB/GYN Status:  No LMP for male patient.  General Assessment  Data Location of Assessment: [facility] ED TTS Assessment: In system Is this a Tele or Face-to-Face Assessment?: Face-to-Face Is this an Initial Assessment or a Re-assessment for this encounter?: Initial Assessment Patient Accompanied by:: N/A Language Other than English: No Living Arrangements: Other (Comment)(Parent) What gender do you identify as?: Male Marital status: Single Living Arrangements: Parent Can pt return to current living arrangement?: Yes Admission Status: Involuntary Petitioner: Family member Is patient capable of signing voluntary admission?: Yes Referral Source: Self/Family/Friend Insurance type: Medicare  Medical Screening Exam [facility] Walk-in ONLY) Medical Exam completed: Yes  Crisis Care Plan Living Arrangements: Parent Legal Guardian: (NA) Name of Psychiatrist: Unknown Name of Therapist: None  Education Status Is patient currently in school?: No Is the patient employed, unemployed or receiving disability?: Unemployed  Risk to self with the past 6 months Suicidal Ideation: No Has patient been a risk to self within the past 6 months prior to admission? : No Suicidal Intent: No Has patient had any suicidal intent within the past 6 months prior to admission? : No Is patient at risk for suicide?: Yes Suicidal Plan?: No Has patient had any suicidal plan within the past 6 months prior to admission? : No Access to Means: No What has been your use of drugs/alcohol within the last 12 months?: Denies current use Previous Attempts/Gestures: Yes How many times?: (Multiple per patient ) Other Self Harm Risks: (Hx of medication non compliance) Triggers for Past Attempts: Unknown Intentional Self Injurious Behavior: None Family Suicide History: No Recent stressful life event(s): (Medication non compliance) Persecutory voices/beliefs?: No Depression: No Depression Symptoms: (Pt denies) Substance abuse history and/or treatment for substance abuse?: No Suicide prevention  information given to non-admitted patients: Not applicable  Risk to Others within the past 6 months Homicidal Ideation: No Does patient have any lifetime risk of violence toward others beyond the six months prior to admission? : No Thoughts of Harm to Others: No Current Homicidal Intent: No Current Homicidal Plan: No Access to Homicidal Means: No Identified Victim: NA History of harm to others?: No Assessment of Violence: On admission Violent Behavior Description: Destruction of property Does patient have access to weapons?: No Criminal Charges Pending?: No Does patient have a court date: No Is patient on probation?: No  Psychosis Hallucinations: Auditory, Visual Delusions: None noted  Mental Status Report Appearance/Hygiene: Unremarkable Eye Contact: Poor Motor Activity: Freedom of movement Speech: Soft, Slow Level of Consciousness: Quiet/awake Mood: Suspicious Affect: Flat Anxiety Level: Minimal Thought Processes: Thought Blocking Judgement: Partial Orientation: Person, Place, Time Obsessive Compulsive Thoughts/Behaviors: None  Cognitive Functioning Concentration: Decreased Memory: Recent Intact, Remote Intact Is patient IDD: No Insight: Poor Impulse Control: Poor Appetite: Fair Have you had any weight changes? : No Change Sleep: No Change Total Hours of Sleep: 6 Vegetative Symptoms: None  ADLScreening [facility] Assessment Services) Patient's cognitive ability adequate to safely complete daily activities?: Yes Patient able to express need for assistance with ADLs?: Yes Independently performs ADLs?: Yes (appropriate for developmental age)  Prior Inpatient Therapy Prior Inpatient Therapy: Yes(Per patient  ) Prior Therapy Dates: (UTA pt cannot recall) Prior Therapy Facilty/Provider(s): (UTA) Reason for Treatment: (UTA)  Prior Outpatient Therapy Prior Outpatient Therapy: (UTA)  ADL Screening (condition  at time of admission) Patient's cognitive ability adequate  to safely complete daily activities?: Yes Is the patient deaf or have difficulty hearing?: No Does the patient have difficulty seeing, even when wearing glasses/contacts?: No Does the patient have difficulty concentrating, remembering, or making decisions?: No Patient able to express need for assistance with ADLs?: Yes Does the patient have difficulty dressing or bathing?: No Independently performs ADLs?: Yes (appropriate for developmental age) Does the patient have difficulty walking or climbing stairs?: No Weakness of Legs: None Weakness of Arms/Hands: None  Home Assistive Devices/Equipment Home Assistive Devices/Equipment: None  Therapy Consults (therapy consults require a physician order) PT Evaluation Needed: No OT Evalulation Needed: No SLP Evaluation Needed: No Abuse/Neglect Assessment (Assessment to be complete while patient is alone) Abuse/Neglect Assessment Can Be Completed: Yes Physical Abuse: Denies Verbal Abuse: Denies Sexual Abuse: Denies Exploitation of patient/patient's resources: Denies Self-Neglect: Denies Values / Beliefs Cultural Requests During Hospitalization: None Spiritual Requests During Hospitalization: None Consults Spiritual Care Consult Needed: No Transition of Care Team Consult Needed: No Advance Directives (For Healthcare) Does Patient Have a Medical Advance Directive?: No Would patient like information on creating a medical advance directive?: No - Patient declined Nutrition Screen- [facility] Adult/[facility] Patient's home diet: Regular        Disposition: Case was staffed with Hall Busing NP who recommended a inpatient admission to assist with stabilization.   Disposition Initial Assessment Completed for this Encounter: Yes Disposition of Patient: Admit Type of inpatient treatment program: Adult  On Site Evaluation by:   Reviewed with Physician:    Mamie Nick 09/21/2019 1:34 PM

## 2019-09-21 NOTE — BH Assessment (Signed)
[facility] Assessment Progress Note  Case was staffed with Tate NP who recommended a inpatient admission to assist with stabilization.    

## 2019-09-22 DIAGNOSIS — R440 Auditory hallucinations: Secondary | ICD-10-CM | POA: Diagnosis not present

## 2019-09-22 NOTE — ED Provider Notes (Signed)
Emergency Medicine Observation Re-evaluation Note  Dylan Flynn is a 29 y.o. male, seen on rounds today.  Pt initially presented to the ED for complaints of Medical Clearance Currently, the patient is medically cleared.  Physical Exam  BP 111/80 (BP Location: Left Arm)   Pulse 92   Temp 98.3 F (36.8 C) (Oral)   Resp 18   Ht 5\' 8"  (1.727 m)   SpO2 96%   BMI 51.70 kg/m  Physical Exam  ED Course / MDM  EKG:EKG Interpretation  Date/Time:  Saturday Sep 21 2019 09:55:05 EDT Ventricular Rate:  106 PR Interval:  118 QRS Duration: 98 QT Interval:  328 QTC Calculation: 435 R Axis:   72 Text Interpretation: Sinus tachycardia Nonspecific T wave abnormality Abnormal ECG No old tracing to compare Confirmed by 07-05-1994 [phone]) on 09/21/2019 10:02:59 AM  Clinical Course as of Sep 22 1038  Sat Sep 21, 2019  1921 29 year old male history of schizophrenia sent in from [facility] for evaluation of increased delusional behavior.  Patient has no medical complaints.  Getting medically screened and then TTS evaluation.  IVC was taken out by [facility] so we will do first exam.   [MB]  1414 Valproic Acid,S(!): 24 [CA]  1415 Lithium(!): 0.29 [CA]    Clinical Course User Index [CA] [facility], PA-C [MB] Mannie Stabile, MD   I have reviewed the labs performed to date as well as medications administered while in observation.  Recent changes in the last 24 hours include psychiatric stabilization with medication adjustments. Plan  Current plan is for inpatient. Patient is under full IVC at this time.   Terrilee Files, MD 09/22/19 [phone]

## 2019-09-22 NOTE — Progress Notes (Signed)
Per the psychiatric team pt is appropriate for referral purposes for inpatient psychiatric hospitalization placement.  CSW will continue to follow for D/C needs.  Katharine Rochefort F. Sayuri Rhames  MSW, LCSW, LCAS, CCS Transitions of Care Clinical Social Worker Care Coordination Department Ph: [phone]  

## 2019-09-22 NOTE — Progress Notes (Signed)
CSW referred pt to:  [facility] Details [facility] Details [facility] Details [facility] Details [facility] Details [facility] Details [facility] Details [facility] Details [facility]  Of note:  Pt was referred to [facility] on Saturday 09/21/19  CSW will continue to follow for D/C needs.  Dorothe Pea. Kamaya Keckler  MSW, LCSW, LCAS, CCS Transitions of Care Clinical Social Worker Care Coordination Department Ph: [phone]

## 2019-09-22 NOTE — Consult Note (Addendum)
[facility] Psych ED Progress Note  09/22/2019 12:04 PM Dylan Flynn  MRN:  [id number] Subjective: Patient states "I feel ok."  Patient assessed by nurse practitioner along with Dr. Jannifer Franklin.  Patient visualized resting, no apparent distress. Patient alert and oriented, answers appropriately. Patient denies suicidal and homicidal ideations today.  No auditory or visual hallucinations reported. Patient currently recommended for inpatient psychiatric treatment.  Patient presented to emergency department yesterday responding to internal stimuli.     Principal Problem: <principal problem not specified> Diagnosis:  Active Problems:   * No active hospital problems. *  Total Time spent with patient: 20 minutes  Past Psychiatric History: Schizophrenia  Past Medical History:  Past Medical History:  Diagnosis Date  . Asthma   . Schizophrenia (HCC)   . Seizures (HCC)    No past surgical history on file. Family History:  Family History  Problem Relation Age of Onset  . Diabetes Mother    Family Psychiatric  History: Unknown Social History:  Social History   Substance and Sexual Activity  Alcohol Use Never     Social History   Substance and Sexual Activity  Drug Use Never    Social History   Socioeconomic History  . Marital status: Single    Spouse name: Not on file  . Number of children: Not on file  . Years of education: Not on file  . Highest education level: Not on file  Occupational History  . Not on file  Tobacco Use  . Smoking status: Never Smoker  . Smokeless tobacco: Never Used  Substance and Sexual Activity  . Alcohol use: Never  . Drug use: Never  . Sexual activity: Never  Other Topics Concern  . Not on file  Social History Narrative  . Not on file   Social Determinants of Health   Financial Resource Strain:   . Difficulty of Paying Living Expenses:   Food Insecurity:   . Worried About Programme researcher, broadcasting/film/video in the Last Year:   . Barista in the  Last Year:   Transportation Needs:   . Freight forwarder (Medical):   Marland Kitchen Lack of Transportation (Non-Medical):   Physical Activity:   . Days of Exercise per Week:   . Minutes of Exercise per Session:   Stress:   . Feeling of Stress :   Social Connections:   . Frequency of Communication with Friends and Family:   . Frequency of Social Gatherings with Friends and Family:   . Attends Religious Services:   . Active Member of Clubs or Organizations:   . Attends Banker Meetings:   Marland Kitchen Marital Status:     Sleep: Fair  Appetite:  Good  Current Medications: Current Facility-Administered Medications  Medication Dose Route Frequency Provider Last Rate Last Admin  . acetaminophen (TYLENOL) tablet 650 mg  650 mg Oral Q4H PRN Aberman, Caroline C, PA-C      . albuterol (PROVENTIL) (2.5 MG/3ML) 0.083% nebulizer solution 3 mL  3 mL Nebulization Q6H PRN Patrcia Dolly, FNP      . alum & mag hydroxide-simeth (MAALOX/MYLANTA) 200-200-20 MG/5ML suspension 30 mL  30 mL Oral Q6H PRN Aberman, Caroline C, PA-C      . cloZAPine (CLOZARIL) tablet 150 mg  150 mg Oral BID Patrcia Dolly, FNP   150 mg at 09/22/19 7169  . lithium carbonate capsule 600 mg  600 mg Oral Daily Patrcia Dolly, FNP   600 mg at 09/22/19 0944  .  valproic acid (DEPAKENE) 250 MG/5ML solution 500 mg  500 mg Oral Daily Patrcia Dolly, FNP   500 mg at 09/22/19 4193   Current Outpatient Medications  Medication Sig Dispense Refill  . albuterol (VENTOLIN HFA) 108 (90 Base) MCG/ACT inhaler Inhale 1-2 puffs into the lungs every 6 (six) hours as needed for wheezing or shortness of breath. 1 Inhaler 11  . cloZAPine (CLOZARIL) 100 MG tablet Take 150 mg by mouth 2 (two) times daily.    . Divalproex Sodium (DEPAKOTE PO) Take 10 mLs by mouth daily.    Marland Kitchen lithium 600 MG capsule Take 600 mg by mouth daily.    Marland Kitchen loperamide (IMODIUM) 2 MG capsule TAKE 1 CAPSULE BY MOUTH AS NEEDED FOR FOR DIARRHEA OR LOOSE STOOLS. 30 capsule 0  . valproic acid  (DEPAKENE) 250 MG/5ML solution Take 10 mLs by mouth daily.    . Vilazodone HCl (VIIBRYD PO) Take by mouth.    Marland Kitchen ketoconazole (NIZORAL) 2 % cream APPLY EXTERNALLY TO THE AFFECTED AREA DAILY (Patient not taking: No sig reported) 15 g 1    Lab Results:  Results for orders placed or performed during the hospital encounter of 09/21/19 (from the past 48 hour(s))  Comprehensive metabolic panel     Status: Abnormal   Collection Time: 09/21/19  9:49 AM  Result Value Ref Range   Sodium 141 135 - 145 mmol/L   Potassium 4.2 3.5 - 5.1 mmol/L   Chloride 106 98 - 111 mmol/L   CO2 28 22 - 32 mmol/L   Glucose, Bld 127 (H) 70 - 99 mg/dL    Comment: Glucose reference range applies only to samples taken after fasting for at least 8 hours.   BUN 12 6 - 20 mg/dL   Creatinine, Ser 7.90 0.61 - 1.24 mg/dL   Calcium 9.2 8.9 - 24.0 mg/dL   Total Protein 7.9 6.5 - 8.1 g/dL   Albumin 3.8 3.5 - 5.0 g/dL   AST 40 15 - 41 U/L   ALT 90 (H) 0 - 44 U/L   Alkaline Phosphatase 146 (H) 38 - 126 U/L   Total Bilirubin 0.5 0.3 - 1.2 mg/dL   GFR calc non Af Amer >60 >60 mL/min   GFR calc Af Amer >60 >60 mL/min   Anion gap 7 5 - 15    Comment: Performed at [facility], [address] [place], [place] [address]  Ethanol     Status: None   Collection Time: 09/21/19  9:49 AM  Result Value Ref Range   Alcohol, Ethyl (B) <10 <10 mg/dL    Comment: (NOTE) Lowest detectable limit for serum alcohol is 10 mg/dL. For medical purposes only. Performed at [facility], [address], [place], [place] [address]   CBC with Diff     Status: Abnormal   Collection Time: 09/21/19  9:49 AM  Result Value Ref Range   WBC 9.6 4.0 - 10.5 K/uL   RBC 5.58 4.22 - 5.81 MIL/uL   Hemoglobin 13.8 13.0 - 17.0 g/dL   HCT 26.8 34.1 - 96.2 %   MCV 80.3 80.0 - 100.0 fL   MCH 24.7 (L) 26.0 - 34.0 pg   MCHC 30.8 30.0 - 36.0 g/dL   RDW 22.9 (H) 79.8 - 92.1 %   Platelets 255 150 - 400 K/uL   nRBC 0.0 0.0 -  0.2 %   Neutrophils Relative % 72 %   Neutro Abs 6.9 1.7 - 7.7 K/uL   Lymphocytes  Relative 19 %   Lymphs Abs 1.8 0.7 - 4.0 K/uL   Monocytes Relative 6 %   Monocytes Absolute 0.6 0.1 - 1.0 K/uL   Eosinophils Relative 2 %   Eosinophils Absolute 0.2 0.0 - 0.5 K/uL   Basophils Relative 0 %   Basophils Absolute 0.0 0.0 - 0.1 K/uL   Immature Granulocytes 1 %   Abs Immature Granulocytes 0.05 0.00 - 0.07 K/uL    Comment: Performed at [facility], [place] [address], [place], [place] [address]  Urine rapid drug screen (hosp performed)     Status: None   Collection Time: 09/21/19  9:50 AM  Result Value Ref Range   Opiates NONE DETECTED NONE DETECTED   Cocaine NONE DETECTED NONE DETECTED   Benzodiazepines NONE DETECTED NONE DETECTED   Amphetamines NONE DETECTED NONE DETECTED   Tetrahydrocannabinol NONE DETECTED NONE DETECTED   Barbiturates NONE DETECTED NONE DETECTED    Comment: (NOTE) DRUG SCREEN FOR MEDICAL PURPOSES ONLY.  IF CONFIRMATION IS NEEDED FOR ANY PURPOSE, NOTIFY LAB WITHIN 5 DAYS. LOWEST DETECTABLE LIMITS FOR URINE DRUG SCREEN Drug Class                     Cutoff (ng/mL) Amphetamine and metabolites    1000 Barbiturate and metabolites    200 Benzodiazepine                 008 Tricyclics and metabolites     300 Opiates and metabolites        300 Cocaine and metabolites        300 THC                            50 Performed at [facility], [place] [address], [place], [place] [address]   SARS Coronavirus 2 by RT PCR (hospital order, performed in [facility] hospital lab) Nasopharyngeal Nasopharyngeal Swab     Status: None   Collection Time: 09/21/19 10:12 AM   Specimen: Nasopharyngeal Swab  Result Value Ref Range   SARS Coronavirus 2 NEGATIVE NEGATIVE    Comment: (NOTE) SARS-CoV-2 target nucleic acids are NOT DETECTED. The SARS-CoV-2 RNA is generally detectable in upper and lower respiratory specimens during the acute phase of infection.  The lowest concentration of SARS-CoV-2 viral copies this assay can detect is 250 copies / mL. A negative result does not preclude SARS-CoV-2 infection and should not be used as the sole basis for treatment or other patient management decisions.  A negative result may occur with improper specimen collection / handling, submission of specimen other than nasopharyngeal swab, presence of viral mutation(s) within the areas targeted by this assay, and inadequate number of viral copies (<250 copies / mL). A negative result must be combined with clinical observations, patient history, and epidemiological information. Fact Sheet for Patients:   StrictlyIdeas.no Fact Sheet for Healthcare Providers: BankingDealers.co.za This test is not yet approved or cleared  by the [place] FDA and has been authorized for detection and/or diagnosis of SARS-CoV-2 by FDA under an Emergency Use Authorization (EUA).  This EUA will remain in effect (meaning this test can be used) for the duration of the COVID-19 declaration under Section 564(b)(1) of the Act, 21 U.S.C. section 360bbb-3(b)(1), unless the authorization is terminated or revoked sooner. Performed at [facility], [place] [address], [place], [place] [address]   Valproic acid level     Status: Abnormal  Collection Time: 09/21/19  1:52 PM  Result Value Ref Range   Valproic Acid Lvl 24 (L) 50.0 - 100.0 ug/mL    Comment: Performed at [facility], [address], [place], [place] [address]  Lithium level     Status: Abnormal   Collection Time: 09/21/19  1:53 PM  Result Value Ref Range   Lithium Lvl 0.29 (L) 0.60 - 1.20 mmol/L    Comment: Performed at [facility], [address] [place], [place] [address]    Blood Alcohol level:  Lab Results  Component Value Date   ETH <10 09/21/2019   ETH <10 05/15/2017    Physical Findings: AIMS:  , ,  ,   ,    CIWA:    COWS:     Musculoskeletal: Strength & Muscle Tone: within normal limits Gait & Station: normal Patient leans: N/A  Psychiatric Specialty Exam: Physical Exam Vitals and nursing note reviewed.  Constitutional:      Appearance: He is well-developed.  HENT:     Head: Normocephalic.  Cardiovascular:     Rate and Rhythm: Normal rate.  Pulmonary:     Effort: Pulmonary effort is normal.  Neurological:     Mental Status: He is alert and oriented to person, place, and time.  Psychiatric:        Attention and Perception: Attention normal.        Mood and Affect: Mood and affect normal.        Speech: Speech normal.        Behavior: Behavior normal. Behavior is cooperative.        Thought Content: Thought content is paranoid.        Cognition and Memory: Cognition normal.        Judgment: Judgment is impulsive.     Review of Systems  Constitutional: Negative.   HENT: Negative.   Eyes: Negative.   Respiratory: Negative.   Cardiovascular: Negative.   Gastrointestinal: Negative.   Genitourinary: Negative.   Musculoskeletal: Negative.   Skin: Negative.   Neurological: Negative.     Blood pressure 116/71, pulse (!) 107, temperature 98.7 F (37.1 C), temperature source Oral, resp. rate 18, height 5\' 8"  (1.727 m), SpO2 94 %.Body mass index is 51.7 kg/m.  General Appearance: Casual  Eye Contact:  Fair  Speech:  Clear and Coherent and Normal Rate  Volume:  Normal  Mood:  Euthymic  Affect:  Congruent  Thought Process:  Disorganized and Descriptions of Associations: Intact  Orientation:  Full (Time, Place, and Person)  Thought Content:  Paranoid Ideation  Suicidal Thoughts:  No  Homicidal Thoughts:  No  Memory:  Immediate;   Good  Judgement:  Fair  Insight:  Lacking  Psychomotor Activity:  Normal  Concentration:  Attention Span: Fair  Recall:  of Knowledge:  Fair  Language:  Fair  Akathisia:  No  Handed:  Right  AIMS (if indicated):     Assets:   Communication Skills Desire for Improvement Financial Resources/Insurance Housing Intimacy Leisure Time Physical Health Resilience Social Support  ADL's:  Intact  Cognition:  WNL  Sleep:         Treatment Plan Summary: We initiated patient's home medications on yesterday. Plan Inpatient psychiatric treatment recommended.  [employer], FNP 09/22/2019, 12:04 PM  Patient seen face-to-face for psychiatric evaluation, chart reviewed and case discussed with the physician extender and developed treatment plan. Reviewed the information documented and agree with the treatment plan. Everlene Cunning  Darleene Cleaver, MD

## 2019-09-22 NOTE — Progress Notes (Signed)
09/22/2019  1233  Patient's mom would like for MD to contact Merlyn Albert at [facility] regarding patient's care.

## 2019-09-23 ENCOUNTER — Encounter (HOSPITAL_COMMUNITY): Payer: Self-pay | Admitting: Registered Nurse

## 2019-09-23 DIAGNOSIS — R45851 Suicidal ideations: Secondary | ICD-10-CM | POA: Insufficient documentation

## 2019-09-23 DIAGNOSIS — R440 Auditory hallucinations: Secondary | ICD-10-CM | POA: Diagnosis not present

## 2019-09-23 DIAGNOSIS — F209 Schizophrenia, unspecified: Secondary | ICD-10-CM

## 2019-09-23 NOTE — ED Notes (Signed)
Pt belongings returned from locker. Pt provided with blue paper scrubs
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
## 2019-09-23 NOTE — Discharge Instructions (Signed)
For your mental health needs, you are advised to continue treatment with [facility]:       [facility]      [address]      [place], [place] [address]      [phone]      Crisis number: [phone]  If for any reason you are unable to follow up with [facility], contact [facility]:       [facility]      [address]      [place], [place] [address]      [phone]      New patients are seen at their walk-in clinic.  Walk-in hours are Monday - Friday from 8:30 am - 12:00 pm, and from 1:00 pm - 2:30 pm.  Walk-in patients are seen on a first come, first served basis, so try to arrive as early as possible for the best chance of being seen the same day. 

## 2019-09-23 NOTE — ED Notes (Addendum)
Called Pharmacy for missing lithium dose not in pixis. Pt verbally agrees to have mother as visitor, called security to wand visitor and take away all belongings before entering secure location.   Pt denies needs, pain or SI at this time.

## 2019-09-23 NOTE — Progress Notes (Signed)
Received Dylan Flynn from TCU with the technician, he was oriented to his new environment and drifted off to sleep in his room shortly after his arrival and slept throughout the morning.

## 2019-09-23 NOTE — BH Assessment (Addendum)
[facility] Assessment Progress Note  Per Shuvon Rankin, FNP, this pt does not require psychiatric hospitalization at this time.  Pt presents under IVC initiated by [facility] staff and upheld by EDP Meridee Score, MD, which has been rescinded by Nelly Rout, MD.  Pt is to be discharged from [facility] with recommendation to follow up with [facility], and with [facility] as a back up plan.  This has been included in pt's discharge instructions.  Pt's nurse, Mel Almond, has been notified.  Doylene Canning, MA Triage Specialist [phone]
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
## 2019-09-23 NOTE — ED Notes (Signed)
I gave patient his breakfast tray 

## 2019-09-23 NOTE — ED Notes (Signed)
Pt provided with charge phone to speak to [facility] NP at [facility] for update on care plan.

## 2019-09-23 NOTE — ED Notes (Signed)
Psych monitor at bedside-NP assessing patient

## 2019-09-23 NOTE — ED Notes (Signed)
Pt provided with lunch tray by Helen NT.  

## 2019-09-23 NOTE — ED Notes (Signed)
Patient's mother would like to know care plan-would like to speak with psychiatrist or NP concerning plan of care

## 2019-09-23 NOTE — ED Notes (Addendum)
Pt ambulatory to [facility]. Per Tom waiting collateral support and information to further determine pts care plan.

## 2019-09-23 NOTE — ED Notes (Signed)
Pt verbalizes understanding of DC instructions. Pt belongings returned and is ambulatory out of ED.  

## 2019-09-24 ENCOUNTER — Telehealth: Payer: Self-pay | Admitting: *Deleted

## 2019-09-24 NOTE — Telephone Encounter (Addendum)
TOC CM received call from pt's mother, Dylan Flynn stating she was told he would have Rx for Clozapine but Rx was not sent to pharmacy. Sent message to NP, Shuvon to follow up with mother. Pt will not be able follow up with [facility] until next month. They did not have any available appts.   Isidoro Donning RN CCM, [facility] ED TOC CM [phone]  09/25/2019 424 pm TOC CM sent message to Dr Lucianne Muss to review case, as NP is not in office today. Spoke to Dr Lucianne Muss and order given to send to pharmacy for one week. Attempted call to FNP, Takia oncall. Waiting call back. Call to pt's mother with update. Left HIPAA compliant message for return call. Isidoro Donning RN CCM, [facility] ED TOC Caryl Ada [phone]  09/25/2019 454 pm Spoke to mother and she would like a call from the MD. Left message for Dr Lucianne Muss to follow up with pt's mother. She has questions on why was the medication increased and did someone notify pt's psychiatrist that medication was increased. Isidoro Donning RN CCM, [facility] ED TOC CM [phone]

## 2019-09-27 ENCOUNTER — Observation Stay (HOSPITAL_COMMUNITY)
Admission: AD | Admit: 2019-09-27 | Discharge: 2019-09-27 | Disposition: A | Discharge status: Home / Self Care | Payer: Medicare HMO | Source: Intra-hospital | Attending: Psychiatry | Admitting: Psychiatry

## 2019-09-27 DIAGNOSIS — Z760 Encounter for issue of repeat prescription: Secondary | ICD-10-CM | POA: Diagnosis not present

## 2019-09-27 LAB — CBC WITH DIFFERENTIAL/PLATELET
Abs Immature Granulocytes: 0.04 10*3/uL (ref 0.00–0.07)
Basophils Absolute: 0 10*3/uL (ref 0.0–0.1)
Basophils Relative: 0 %
Eosinophils Absolute: 0.2 10*3/uL (ref 0.0–0.5)
Eosinophils Relative: 2 %
HCT: 43 % (ref 39.0–52.0)
Hemoglobin: 13.4 g/dL (ref 13.0–17.0)
Immature Granulocytes: 0 %
Lymphocytes Relative: 33 %
Lymphs Abs: 3 10*3/uL (ref 0.7–4.0)
MCH: 24.9 pg — ABNORMAL LOW (ref 26.0–34.0)
MCHC: 31.2 g/dL (ref 30.0–36.0)
MCV: 79.9 fL — ABNORMAL LOW (ref 80.0–100.0)
Monocytes Absolute: 0.7 10*3/uL (ref 0.1–1.0)
Monocytes Relative: 8 %
Neutro Abs: 5.2 10*3/uL (ref 1.7–7.7)
Neutrophils Relative %: 57 %
Platelets: 281 10*3/uL (ref 150–400)
RBC: 5.38 MIL/uL (ref 4.22–5.81)
RDW: 15.8 % — ABNORMAL HIGH (ref 11.5–15.5)
WBC: 9.3 10*3/uL (ref 4.0–10.5)
nRBC: 0 % (ref 0.0–0.2)

## 2019-09-27 LAB — VALPROIC ACID LEVEL: Valproic Acid Lvl: 19 ug/mL — ABNORMAL LOW (ref 50.0–100.0)

## 2019-09-27 MED ORDER — CLOZAPINE 100 MG PO TABS: 150.0000 mg | ORAL_TABLET | Freq: Two times a day (BID) | ORAL | 0 refills | Status: AC

## 2019-09-27 NOTE — Discharge Summary (Addendum)
Discharge from OBS Unit  Dylan Flynn is an 29 y.o. male presents for sole purpose of labs and medication management. Patient was recently seen at [facility] and discharged home with incorrect dose of Clozaril. Upon chart review it was determined that patient was previously taking Clozaril 150mg  po daily. He was discharged on Clozaril 150mg  po BID, which required that he take more of his home medications. Mother was concerned his prescription would run out and is requesting a new rx at the time. Patient is alert and oriented, calm and cooperative on exam. He is quiet but positive interaction noted, and he would like to be discharged home. Patient mother is present with him.   Total Time spent with patient: 1.5 hours  Psychiatric Specialty Exam: Physical Exam  Nursing note and vitals reviewed.   Review of Systems  There were no vitals taken for this visit.There is no height or weight on file to calculate BMI.  General Appearance: Fairly Groomed  Eye Contact:  Fair  Speech:  Clear and Coherent and Normal Rate  Volume:  Normal  Mood:  Euthymic  Affect:  Appropriate and Congruent  Thought Process:  Coherent, Linear and Descriptions of Associations: Intact  Orientation:  Full (Time, Place, and Person)  Thought Content:  Logical  Suicidal Thoughts:  No  Homicidal Thoughts:  No  Memory:  Immediate;   Good Recent;   Good  Judgement:  Intact  Insight:  Fair  Psychomotor Activity:  Normal  Concentration: Concentration: Fair and Attention Span: Fair  Recall:  of Knowledge:Fair  Language: Fair  Akathisia:  No  Handed:  Right  AIMS (if indicated):     Assets:  Communication Skills Desire for Improvement Financial Resources/Insurance Housing Leisure Time Physical Health Resilience Social Support  Sleep:       Musculoskeletal: Strength & Muscle Tone: within normal limits Gait & Station: normal Patient leans: N/A  There were no vitals taken for this  visit.  Recommendations:  Based on my evaluation the patient does not appear to have an emergency medical condition.   Patient labs were obtained on admission to include CBC w/diff, valproic acid level, and clozaril level. His CBC labs were entered stat and resulted fairly quickly to allow for appropriate coordination of care. This agreeable with mother and patient, discharged patient home with instructions to follow up with [facility] or [facility] for lab draw and medication management. She was given a copy of his labs and a copy which was faxed to the pharmacy. Writer also contacted the pharmacy and spoke with [facility] (pharmacist) to confirm prescription had been received. It was determined that writer was not eligible to prescribe Clozaril, and therefore prescription was filled in [facility] name. Prescription can only be filled for 7, 14, or 28 day we(Ben and myself) both agreed to refill for 7 days as patient should have 1 week left of the original prescription(total of 22 days worth). This was all explained to mother who verbalized understanding. Will place discharge order at this time as patient is in no medical distress, no signs or physical evidence of neutropenia, and no suicidal or homicidal ideations. Safety zone was completed due to potential adverse event from incorrect dose of medication entered,  and failure to verify correct medication.    Romeo Apple, FNP 09/27/2019, 6:39 PM

## 2019-09-27 NOTE — H&P (Signed)
Behavioral Health Medical Screening Exam  Dylan Flynn is an 29 y.o. male presents for sole purpose of labs and medication management. Patient was recently seen at [facility] and discharged home with incorrect dose of Clozaril. Upon chart review it was determined that patient was previously taking Clozaril 150mg  po daily. He was discharged on Clozaril 150mg  po BID, which required that he take more of his home medications. Mother was concerned his prescription would run out and is requesting a new rx at the time. Patient is alert and oriented, calm and cooperative on exam. He is quiet but positive interaction noted, and he would like to be discharged home. Patient mother is present with him.   Total Time spent with patient: 1.5 hours  Psychiatric Specialty Exam: Physical Exam  Nursing note and vitals reviewed.   Review of Systems  There were no vitals taken for this visit.There is no height or weight on file to calculate BMI.  General Appearance: Fairly Groomed  Eye Contact:  Fair  Speech:  Clear and Coherent and Normal Rate  Volume:  Normal  Mood:  Euthymic  Affect:  Appropriate and Congruent  Thought Process:  Coherent, Linear and Descriptions of Associations: Intact  Orientation:  Full (Time, Place, and Person)  Thought Content:  Logical  Suicidal Thoughts:  No  Homicidal Thoughts:  No  Memory:  Immediate;   Good Recent;   Good  Judgement:  Intact  Insight:  Fair  Psychomotor Activity:  Normal  Concentration: Concentration: Fair and Attention Span: Fair  Recall:  of Knowledge:Fair  Language: Fair  Akathisia:  No  Handed:  Right  AIMS (if indicated):     Assets:  Communication Skills Desire for Improvement Financial Resources/Insurance Housing Leisure Time Physical Health Resilience Social Support  Sleep:       Musculoskeletal: Strength & Muscle Tone: within normal limits Gait & Station: normal Patient leans: N/A  There were no vitals taken for this  visit.  Recommendations:  Based on my evaluation the patient does not appear to have an emergency medical condition.   Patient labs were obtained on admission to include CBC w/diff, valproic acid level, and clozaril level. His CBC labs were entered stat and resulted fairly quickly to allow for appropriate coordination of care. This agreeable with mother and patient, discharged patient home with instructions to follow up with [facility] or [facility] for lab draw and medication management. She was given a copy of his labs and a copy which was faxed to the pharmacy. Writer also contacted the pharmacy and spoke with [facility] (pharmacist) to confirm prescription had been received. It was determined that writer was not eligible to prescribe Clozaril, and therefore prescription was filled in [facility] name. Prescription can only be filled for 7, 14, or 28 day we(Ben and myself) both agreed to refill for 7 days as patient should have 1 week left of the original prescription(total of 22 days worth). This was all explained to mother who verbalized understanding. Will place discharge order at this time as patient is in no medical distress, no signs or physical evidence of neutropenia, and no suicidal or homicidal ideations.    Romeo Apple, FNP 09/27/2019, 6:39 PM

## 2019-10-05 ENCOUNTER — Ambulatory Visit (HOSPITAL_COMMUNITY)
Admission: RE | Admit: 2019-10-05 | Discharge: 2019-10-05 | Disposition: A | Discharge status: Home / Self Care | Payer: Medicare HMO | Attending: Psychiatry | Admitting: Psychiatry

## 2019-10-06 ENCOUNTER — Other Ambulatory Visit: Payer: Self-pay

## 2019-10-06 ENCOUNTER — Emergency Department (HOSPITAL_COMMUNITY): Payer: Medicare HMO

## 2019-10-06 ENCOUNTER — Emergency Department (HOSPITAL_COMMUNITY)
Admission: EM | Admit: 2019-10-06 | Discharge: 2019-10-07 | Disposition: A | Discharge status: Other Acute Inpatient Hospital | Payer: Medicare HMO | Attending: Emergency Medicine | Admitting: Emergency Medicine

## 2019-10-06 ENCOUNTER — Observation Stay (HOSPITAL_COMMUNITY)
Admission: RE | Admit: 2019-10-06 | Discharge: 2019-10-06 | Disposition: A | Discharge status: Home / Self Care | Payer: Medicare HMO | Source: Home / Self Care | Attending: Psychiatry | Admitting: Psychiatry

## 2019-10-06 DIAGNOSIS — Y92239 Unspecified place in hospital as the place of occurrence of the external cause: Secondary | ICD-10-CM | POA: Diagnosis not present

## 2019-10-06 DIAGNOSIS — R456 Violent behavior: Secondary | ICD-10-CM | POA: Diagnosis not present

## 2019-10-06 DIAGNOSIS — J45909 Unspecified asthma, uncomplicated: Secondary | ICD-10-CM | POA: Insufficient documentation

## 2019-10-06 DIAGNOSIS — Y999 Unspecified external cause status: Secondary | ICD-10-CM | POA: Insufficient documentation

## 2019-10-06 DIAGNOSIS — X789XXA Intentional self-harm by unspecified sharp object, initial encounter: Secondary | ICD-10-CM | POA: Insufficient documentation

## 2019-10-06 DIAGNOSIS — Z20822 Contact with and (suspected) exposure to covid-19: Secondary | ICD-10-CM | POA: Insufficient documentation

## 2019-10-06 DIAGNOSIS — S60511A Abrasion of right hand, initial encounter: Secondary | ICD-10-CM | POA: Diagnosis not present

## 2019-10-06 DIAGNOSIS — Y9389 Activity, other specified: Secondary | ICD-10-CM | POA: Insufficient documentation

## 2019-10-06 DIAGNOSIS — S8392XA Sprain of unspecified site of left knee, initial encounter: Secondary | ICD-10-CM | POA: Insufficient documentation

## 2019-10-06 DIAGNOSIS — R4689 Other symptoms and signs involving appearance and behavior: Secondary | ICD-10-CM

## 2019-10-06 LAB — COMPREHENSIVE METABOLIC PANEL
ALT: 69 U/L — ABNORMAL HIGH (ref 0–44)
AST: 49 U/L — ABNORMAL HIGH (ref 15–41)
Albumin: 3.8 g/dL (ref 3.5–5.0)
Alkaline Phosphatase: 121 U/L (ref 38–126)
Anion gap: 15 (ref 5–15)
BUN: 12 mg/dL (ref 6–20)
CO2: 19 mmol/L — ABNORMAL LOW (ref 22–32)
Calcium: 9 mg/dL (ref 8.9–10.3)
Chloride: 104 mmol/L (ref 98–111)
Creatinine, Ser: 1.09 mg/dL (ref 0.61–1.24)
GFR calc Af Amer: >60 mL/min (ref >60)
GFR calc non Af Amer: >60 mL/min (ref >60)
Glucose, Bld: 114 mg/dL — ABNORMAL HIGH (ref 70–99)
Potassium: 3.3 mmol/L — ABNORMAL LOW (ref 3.5–5.1)
Sodium: 138 mmol/L (ref 135–145)
Total Bilirubin: 0.5 mg/dL (ref 0.3–1.2)
Total Protein: 7.9 g/dL (ref 6.5–8.1)

## 2019-10-06 LAB — CBC WITH DIFFERENTIAL/PLATELET
Abs Immature Granulocytes: 0.04 10*3/uL (ref 0.00–0.07)
Basophils Absolute: 0 10*3/uL (ref 0.0–0.1)
Basophils Relative: 0 %
Eosinophils Absolute: 0.1 10*3/uL (ref 0.0–0.5)
Eosinophils Relative: 1 %
HCT: 42.8 % (ref 39.0–52.0)
Hemoglobin: 13.1 g/dL (ref 13.0–17.0)
Immature Granulocytes: 0 %
Lymphocytes Relative: 31 %
Lymphs Abs: 3.2 10*3/uL (ref 0.7–4.0)
MCH: 24.7 pg — ABNORMAL LOW (ref 26.0–34.0)
MCHC: 30.6 g/dL (ref 30.0–36.0)
MCV: 80.8 fL (ref 80.0–100.0)
Monocytes Absolute: 0.7 10*3/uL (ref 0.1–1.0)
Monocytes Relative: 7 %
Neutro Abs: 6.2 10*3/uL (ref 1.7–7.7)
Neutrophils Relative %: 61 %
Platelets: 245 10*3/uL (ref 150–400)
RBC: 5.3 MIL/uL (ref 4.22–5.81)
RDW: 15.9 % — ABNORMAL HIGH (ref 11.5–15.5)
WBC: 10.3 10*3/uL (ref 4.0–10.5)
nRBC: 0 % (ref 0.0–0.2)

## 2019-10-06 LAB — VALPROIC ACID LEVEL: Valproic Acid Lvl: 38 ug/mL — ABNORMAL LOW (ref 50.0–100.0)

## 2019-10-06 LAB — SARS CORONAVIRUS 2 BY RT PCR (HOSPITAL ORDER, PERFORMED IN ~~LOC~~ HOSPITAL LAB): SARS Coronavirus 2: NEGATIVE

## 2019-10-06 LAB — LITHIUM LEVEL: Lithium Lvl: 0.49 mmol/L — ABNORMAL LOW (ref 0.60–1.20)

## 2019-10-06 MED ORDER — ZIPRASIDONE MESYLATE 20 MG IM SOLR: 20.0000 mg | Freq: Once | INTRAMUSCULAR | Status: DC

## 2019-10-06 MED ORDER — VALPROIC ACID 250 MG/5ML PO SOLN
500.0000 mg | Freq: Every day | ORAL | Status: DC
  Administered 2019-10-06 – 2019-10-07 (×2): 500 mg via ORAL
  Filled 2019-10-06 (×2): qty 10

## 2019-10-06 MED ORDER — CLOZAPINE 25 MG PO TABS
150.0000 mg | ORAL_TABLET | Freq: Two times a day (BID) | ORAL | Status: DC
  Administered 2019-10-06 – 2019-10-07 (×3): 150 mg via ORAL
  Filled 2019-10-06 (×3): qty 2

## 2019-10-06 MED ORDER — ZIPRASIDONE MESYLATE 20 MG IM SOLR
INTRAMUSCULAR | Status: AC
  Administered 2019-10-06: 20 mg via INTRAMUSCULAR
  Filled 2019-10-06: qty 20

## 2019-10-06 MED ORDER — LORAZEPAM 2 MG/ML IJ SOLN
INTRAMUSCULAR | Status: AC
  Administered 2019-10-06: 2 mg via INTRAMUSCULAR
  Filled 2019-10-06: qty 1

## 2019-10-06 MED ORDER — KETAMINE HCL 50 MG/5ML IJ SOSY
100.0000 mg | PREFILLED_SYRINGE | Freq: Once | INTRAMUSCULAR | Status: AC
  Administered 2019-10-06: 100 mg via INTRAMUSCULAR
  Filled 2019-10-06: qty 10

## 2019-10-06 MED ORDER — DIPHENHYDRAMINE HCL 50 MG/ML IJ SOLN
INTRAMUSCULAR | Status: AC
  Administered 2019-10-06: 50 mg via INTRAMUSCULAR
  Filled 2019-10-06: qty 1

## 2019-10-06 MED ORDER — DIPHENHYDRAMINE HCL 50 MG/ML IJ SOLN: 50.0000 mg | Freq: Once | INTRAMUSCULAR | Status: DC

## 2019-10-06 MED ORDER — ALBUTEROL SULFATE HFA 108 (90 BASE) MCG/ACT IN AERS: 1.0000 | INHALATION_SPRAY | Freq: Four times a day (QID) | RESPIRATORY_TRACT | Status: DC | PRN

## 2019-10-06 MED ORDER — VILAZODONE HCL 10 MG PO TABS
40.0000 mg | ORAL_TABLET | Freq: Every day | ORAL | Status: DC
  Administered 2019-10-06: 40 mg via ORAL
  Filled 2019-10-06 (×4): qty 4

## 2019-10-06 MED ORDER — LORAZEPAM 2 MG/ML IJ SOLN: 2.0000 mg | Freq: Once | INTRAMUSCULAR | Status: DC

## 2019-10-06 MED ORDER — LITHIUM CARBONATE 300 MG PO CAPS
600.0000 mg | ORAL_CAPSULE | Freq: Every day | ORAL | Status: DC
  Administered 2019-10-06 – 2019-10-07 (×2): 600 mg via ORAL
  Filled 2019-10-06 (×2): qty 2

## 2019-10-06 NOTE — Progress Notes (Signed)
When pt being escorted to OBS RM 203, pt became very agitated, belligerent, screaming and ran out into the hallway and kicked the wall with Left leg injuring Left knee.  Pt trashed the room.  Bleeding noted to Left hand.  [employer] at bedside, security & staff at bedside.  NP Nira Conn at bedside, Med orders given.  Geodon 20 mg, Ativan 2mg , Benadryl 50mg  given IM.  EMS arrived at bedside to transport pt to [facility].

## 2019-10-06 NOTE — Progress Notes (Signed)
10/06/2019  0815  Spoke with AC Marie in regards to when will patient be able to transport back to [facility]. Per Hilda Lias she will check on a bed and give [place] a call back.

## 2019-10-06 NOTE — ED Notes (Signed)
Patients mother contacted and updated.

## 2019-10-06 NOTE — ED Notes (Signed)
Condom cath placed on patient

## 2019-10-06 NOTE — BH Assessment (Signed)
Completed Affidavit and Petition and First Examination for IVC and faxed forms to [employer].   Pamalee Leyden, [facility], [facility] Triage Specialist [phone]

## 2019-10-06 NOTE — ED Notes (Signed)
Patient has been calm and cooperative since he has been in this unit.  His mother is at his bedside.  He denies S/I , H/I, and AVH

## 2019-10-06 NOTE — BH Assessment (Signed)
Assessment Note  Dylan Flynn is a 29 y.o. male who was voluntarily brought to [facility] by his mother and grandmother due to pt yelling and swearing at home. Pt states, "I'm not feeling well at all. I was angry in general. I was shouting and threatening. I don't really remember what I said." Pt gave clinician verbal consent to speak to his mother; who stated pt "has been screaming out and swearing." Pt's mother states pt said he was not hearing voices, though he told the officer at [facility] that he heard her say something completely different (something negative to him about him "smelling like shit") than what she did say to him (something about children acting out). Pt's mother shared pt mentioned names when he screamed out/said their names like he was talking in conversation; she shared all of this is brand new and had never occurred in the past.  Pt denies current SI, though he acknowledges he's experienced SI in the past, most recently several weeks ago. Pt acknowledges he's attempted to kill himself several times in the past, but states this was over 10 years ago. Pt shares he was in the hospital at [facility] for 2-3 days several weeks ago; it's not clear if this was for mental health reasons.  Pt denies HI, though he stated he was experiencing HI on his intake sheet. Pt also shouted that he wanted to kill someone after he was brought back onto the unit, though he said he didn't want to hurt any person, it was more directed towards no-one in particular (read: the voices he was hearing). Pt denies AVH, though, while completing his assessment with clinician, pt required many of his questions to be repeated numerous times, as he appeared to be distracted, as if he was hearing internal stimuli. Pt denied NSSIB, access to guns/weapons (his mother confirmed this), engagement with the legal system, or SA.  It was determined that pt would benefit from staying overnight on the Observation Unit for safety and stability and  then being re-assessed in the morning by psychiatry. Pt and his mother were agreeable to this. Pt left the lobby and was directed to his room and, as he walked there, he told Patient Access, "you have 10 minutes to get me in restraints." This was not said in a threatening manner, but as matter-of-fact. Patient Access then reports that, while signing his paperwork, pt began yelling, "Shut up!" and was looking at the wall. As Patient Access left the room and walked down the hallway, pt came out of his room and began running down the hallway, then ran back into his room and began yelling, screaming, and hitting things. Pt then kicked his door open and came into the hallway, where he collapsed at the end of the hall. Pt was triaged for his injuries by [facility], was provided medication to assist him in calming down, and was transported to [facility] to have his knee and his hand medically cared for. Pt was IVCed.  Pt's protective factors are his family support and his consistent housing. He does not use substances.  Pt is oriented x4. His recent and remote memory is intact. Pt was cooperative, though distracted throughout the assessment process. Pt's insight and judgement is fair at this time; his impulse control is poor.   Diagnosis: F20.9, Schizophrenia    Past Medical History:  Past Medical History:  Diagnosis Date   Asthma    Schizophrenia ([place])    Seizures ([place])     No past surgical  history on file.  Family History:  Family History  Problem Relation Age of Onset   Diabetes Mother     Social History:  reports that he has never smoked. He has never used smokeless tobacco. He reports that he does not drink alcohol or use drugs.  Additional Social History:  Alcohol / Drug Use Pain Medications: Please see MAR Prescriptions: Please see MAR Over the Counter: Please see MAR History of alcohol / drug use?: No history of alcohol / drug abuse Longest period of sobriety (when/how long): Pt denies  SA  CIWA: CIWA-Ar BP: (!) 140/97 Pulse Rate: (!) 110 COWS:    Allergies:  Allergies  Allergen Reactions   Other     Tree nuts   Shellfish Allergy     Home Medications: (Not in a hospital admission)   OB/GYN Status:  No LMP for male patient.  General Assessment Data Location of Assessment: [facility] Assessment Services TTS Assessment: In system Is this a Tele or Face-to-Face Assessment?: Face-to-Face Is this an Initial Assessment or a Re-assessment for this encounter?: Initial Assessment Patient Accompanied by:: Parent(Dylan Flynn, mother: [phone]) Language Other than English: No Living Arrangements: Other (Comment)(Pt lives w/ his mother) What gender do you identify as?: Male Date Telepsych consult ordered in [facility]: 10/05/19 Time Telepsych consult ordered in [facility]: 2330 Marital status: Single Living Arrangements: Parent Can pt return to current living arrangement?: Yes Admission Status: Involuntary Petitioner: Other(Dylan Flynn, TTS) Is patient capable of signing voluntary admission?: No Referral Source: Self/Family/Friend Insurance type: [employer] Medicare  Medical Screening Exam ([place]) Medical Exam completed: Yes  Crisis Care Plan Living Arrangements: Parent Legal Guardian: Other:(Self) Name of Psychiatrist: Dr. Dionne Flynn Name of Therapist: None  Education Status Is patient currently in school?: No Is the patient employed, unemployed or receiving disability?: Receiving disability income  Risk to self with the past 6 months Suicidal Ideation: No Has patient been a risk to self within the past 6 months prior to admission? : Yes Suicidal Intent: No Has patient had any suicidal intent within the past 6 months prior to admission? : No Is patient at risk for suicide?: No Suicidal Plan?: No Has patient had any suicidal plan within the past 6 months prior to admission? : No Access to Means: No What has been your use of drugs/alcohol within the  last 12 months?: Pt denies SI Previous Attempts/Gestures: No How many times?: 0 Other Self Harm Risks: Pt is currently experiencing AH Triggers for Past Attempts: None known Intentional Self Injurious Behavior: None Family Suicide History: No Recent stressful life event(s): Turmoil (Comment)(Pt is currently experiencing AH) Persecutory voices/beliefs?: No Depression: No Depression Symptoms: Despondent Substance abuse history and/or treatment for substance abuse?: No Suicide prevention information given to non-admitted patients: Not applicable  Risk to Others within the past 6 months Homicidal Ideation: Yes-Currently Present Does patient have any lifetime risk of violence toward others beyond the six months prior to admission? : No Thoughts of Harm to Others: Yes-Currently Present Comment - Thoughts of Harm to Others: Pt is experiencing HI due to [facility] Current Homicidal Intent: No Current Homicidal Plan: No Access to Homicidal Means: No Identified Victim: Pt is currently experiencing HI towards the AH History of harm to others?: No Assessment of Violence: On admission Violent Behavior Description: Pt has been yelling and swearing, tore up his room at [facility] Does patient have access to weapons?: No(Pt denied access to guns/weapons, his mother confirmed) Criminal Charges Pending?: No Does patient have  a court date: No Is patient on probation?: No  Psychosis Hallucinations: Auditory Delusions: None noted  Mental Status Report Appearance/Hygiene: Unremarkable Eye Contact: Fair Motor Activity: Unremarkable Speech: Soft, Slow, Other (Comment)(Pt paused after questions, required questions repeated) Level of Consciousness: Quiet/awake Mood: Anxious Affect: Flat Anxiety Level: Minimal Thought Processes: Coherent Judgement: Partial Orientation: Person, Place, Time, Situation Obsessive Compulsive Thoughts/Behaviors: None  Cognitive Functioning Concentration: Fair Memory: Recent  Intact, Remote Intact Is patient IDD: No Insight: Fair Impulse Control: Poor Appetite: Good Have you had any weight changes? : No Change Sleep: No Change Total Hours of Sleep: 7 Vegetative Symptoms: None  ADLScreening [facility] Assessment Services) Patient's cognitive ability adequate to safely complete daily activities?: Yes Patient able to express need for assistance with ADLs?: Yes Independently performs ADLs?: Yes (appropriate for developmental age)  Prior Inpatient Therapy Prior Inpatient Therapy: Yes Prior Therapy Dates: Pt cannot recall Prior Therapy Facilty/Provider(s): Pt cannnot recall Reason for Treatment: AH, Schizophrenia  Prior Outpatient Therapy Prior Outpatient Therapy: No Does patient have an ACCT team?: No Does patient have Intensive In-House Services?  : No Does patient have [facility] services? : Yes Does patient have P4CC services?: No  ADL Screening (condition at time of admission) Patient's cognitive ability adequate to safely complete daily activities?: Yes Is the patient deaf or have difficulty hearing?: No Does the patient have difficulty seeing, even when wearing glasses/contacts?: No Does the patient have difficulty concentrating, remembering, or making decisions?: Yes Patient able to express need for assistance with ADLs?: Yes Does the patient have difficulty dressing or bathing?: No Independently performs ADLs?: Yes (appropriate for developmental age) Does the patient have difficulty walking or climbing stairs?: No Weakness of Legs: None Weakness of Arms/Hands: None  Home Assistive Devices/Equipment Home Assistive Devices/Equipment: None  Therapy Consults (therapy consults require a physician order) PT Evaluation Needed: No OT Evalulation Needed: No SLP Evaluation Needed: No Abuse/Neglect Assessment (Assessment to be complete while patient is alone) Abuse/Neglect Assessment Can Be Completed: Yes Physical Abuse: Denies Verbal Abuse:  Denies Sexual Abuse: Denies Exploitation of patient/patient's resources: Denies Self-Neglect: Denies Values / Beliefs Cultural Requests During Hospitalization: None Spiritual Requests During Hospitalization: None Consults Spiritual Care Consult Needed: No Transition of Care Team Consult Needed: No Advance Directives (For Healthcare) Does Patient Have a Medical Advance Directive?: Unable to assess, patient is non-responsive or altered mental status          Disposition: [facility] Rom[facility] initially determined pt met inpatient criteria; after pt's outburst, it was determined that pt meets inpatient criteria. Pt was transported to [facility] for medical clearance. After pt is medically cleared, pt's referral information will be reviewed by [facility] and it will be determined if there is an appropriate bed available; if there isn't, pt's referral information will be faxed out to multiple hospitals for potential placement.   Disposition Initial Assessment Completed for this Encounter: Yes Disposition of Patient: Admit(Jason Gwenlyn Found, NP, determined pt meets inpatient criteria) Type of inpatient treatment program: Adult Patient refused recommended treatment: No Mode of transportation if patient is discharged/movement?: N/A Patient referred to: Other (Comment)(Pt's referral information will be referred to [facility] & others)  On Site Evaluation by:   Reviewed with Physician:    Dannielle Burn 10/06/2019 2:18 AM
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
## 2019-10-06 NOTE — Progress Notes (Signed)
Patient meets criteria for inpatient treatment per Nira Conn, NP. No appropriate beds at [facility] currently. CSW faxed referrals to the following facilities for review:   [facility]   [facility]  [facility]  [facility]   [facility]  [facility]  [facility]  [facility]  [facility]   [facility]   [facility]  [facility]   [facility]  [facility]   [facility]   TTS will continue to seek bed placement.     Ruthann Cancer MSW, [facility] Clincal Social Worker [facility] Ph: [phone] Fax: [phone] 10/06/2019 8:26 AM

## 2019-10-06 NOTE — ED Notes (Signed)
Patients belongings are at nurses station. Labeled with stickers. Glasses are at bedside.

## 2019-10-06 NOTE — ED Notes (Signed)
Pt has not had breakfast due to pt sleeping, I had ask again but he said not right now but breakfast tray is still in his room.

## 2019-10-06 NOTE — ED Notes (Addendum)
PER Adult BH there are no beds available at this time.

## 2019-10-06 NOTE — Progress Notes (Signed)
Pt accepted to [facility] Loyola Mast is the accepting provider.  Call report to [phone] [facility] ED notified.   Pt is IVC  Pt may be transported by [facility] Enforcement Pt scheduled to arrive on Monday 10/07/19 after 10am.   Patient will need a negative COVID result prior to being transported to [facility].   CSW will continue to seek placement that may be able to accept this patient earlier than this date and time.    Ruthann Cancer MSW, [facility] Clincal Social Worker  [facility] Ph: [phone] Fax: [phone]  10/06/2019 8:36 AM
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
## 2019-10-07 DIAGNOSIS — S8392XA Sprain of unspecified site of left knee, initial encounter: Secondary | ICD-10-CM | POA: Diagnosis not present

## 2019-10-07 NOTE — ED Provider Notes (Signed)
Emergency Medicine Observation Re-evaluation Note  Edvardo Honse is a 29 y.o. male, seen on rounds today.  Pt initially presented to the ED for complaints of Medical Clearance Currently, the patient is awaiting inpatient psychiatric treatment.  Physical Exam  BP 128/89 (BP Location: Right Arm)   Pulse 97   Temp 98.3 F (36.8 C) (Oral)   Resp 18   SpO2 94%  Physical Exam Resting comfortably ED Course / MDM  EKG:EKG Interpretation  Date/Time:  Sunday October 06 2019 01:45:59 EDT Ventricular Rate:  118 PR Interval:    QRS Duration: 94 QT Interval:  337 QTC Calculation: 473 R Axis:   68 Text Interpretation: Sinus tachycardia Borderline T abnormalities, inferior leads Borderline prolonged QT interval No significant change since last tracing Confirmed by Zadie Rhine ([id number]) on 10/06/2019 3:04:32 AM    I have reviewed the labs performed to date as well as medications administered while in observation.  No Recent changes in the last 24 hours . Plan  Current plan is for inpatient treatment.    Linwood Dibbles, MD 10/07/19 [phone]

## 2019-10-07 NOTE — Discharge Summary (Signed)
  Patient to be transferred to [place] for inpatient psychiatric treatment 

## 2019-10-07 NOTE — ED Notes (Signed)
Transported to [facility] by [employer]. Belongings returned to [facility] for pt. He work his own clothes. He was cooperative.

## 2019-10-11 ENCOUNTER — Ambulatory Visit (INDEPENDENT_AMBULATORY_CARE_PROVIDER_SITE_OTHER): Payer: Medicare HMO | Admitting: Otolaryngology

## 2019-10-25 ENCOUNTER — Encounter: Payer: Self-pay | Admitting: Family Medicine

## 2019-10-25 ENCOUNTER — Other Ambulatory Visit: Payer: Self-pay

## 2019-10-25 ENCOUNTER — Ambulatory Visit (HOSPITAL_COMMUNITY)
Admission: RE | Admit: 2019-10-25 | Discharge: 2019-10-25 | Disposition: A | Discharge status: Home / Self Care | Payer: Medicare HMO | Source: Ambulatory Visit | Attending: Family Medicine | Admitting: Family Medicine

## 2019-10-25 ENCOUNTER — Ambulatory Visit (INDEPENDENT_AMBULATORY_CARE_PROVIDER_SITE_OTHER): Payer: Medicare HMO | Admitting: Family Medicine

## 2019-10-25 VITALS — BP 114/64 | HR 119 | Ht 68.0 in | Wt 336.0 lb

## 2019-10-25 DIAGNOSIS — M25562 Pain in left knee: Secondary | ICD-10-CM | POA: Insufficient documentation

## 2019-10-25 MED ORDER — NAPROXEN 500 MG PO TABS: 500.0000 mg | ORAL_TABLET | Freq: Two times a day (BID) | ORAL | 0 refills | Status: DC

## 2019-10-25 MED ORDER — DICLOFENAC SODIUM 1 % EX GEL: 2.0000 g | Freq: Four times a day (QID) | CUTANEOUS | 1 refills | Status: DC

## 2019-10-25 NOTE — Patient Instructions (Signed)
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
## 2019-11-14 ENCOUNTER — Encounter (INDEPENDENT_AMBULATORY_CARE_PROVIDER_SITE_OTHER): Payer: Self-pay | Admitting: Otolaryngology

## 2019-11-14 ENCOUNTER — Ambulatory Visit (INDEPENDENT_AMBULATORY_CARE_PROVIDER_SITE_OTHER): Payer: Medicare HMO | Admitting: Otolaryngology

## 2019-11-14 ENCOUNTER — Other Ambulatory Visit: Payer: Self-pay

## 2019-11-14 VITALS — Temp 97.3°F

## 2019-11-14 DIAGNOSIS — L723 Sebaceous cyst: Secondary | ICD-10-CM | POA: Diagnosis not present

## 2019-11-14 NOTE — Progress Notes (Signed)
HPI: Dylan Flynn is a 29 y.o. male who presents is referred by Dr. Manson Passey for evaluation of small bumps in the front of his neck in the submental region.  He presents today with his mother.  He has noted these small bumps for several weeks now.  They are not painful.  Denies soreness in his mouth or trouble swallowing..  Past Medical History:  Diagnosis Date  . Asthma   . Schizophrenia (HCC)   . Seizures (HCC)    No past surgical history on file. Social History   Socioeconomic History  . Marital status: Single    Spouse name: Not on file  . Number of children: Not on file  . Years of education: Not on file  . Highest education level: Not on file  Occupational History  . Not on file  Tobacco Use  . Smoking status: Never Smoker  . Smokeless tobacco: Never Used  Vaping Use  . Vaping Use: Never used  Substance and Sexual Activity  . Alcohol use: Never  . Drug use: Never  . Sexual activity: Never  Other Topics Concern  . Not on file  Social History Narrative  . Not on file   Social Determinants of Health   Financial Resource Strain:   . Difficulty of Paying Living Expenses:   Food Insecurity:   . Worried About Programme researcher, broadcasting/film/video in the Last Year:   . Barista in the Last Year:   Transportation Needs:   . Freight forwarder (Medical):   Marland Kitchen Lack of Transportation (Non-Medical):   Physical Activity:   . Days of Exercise per Week:   . Minutes of Exercise per Session:   Stress:   . Feeling of Stress :   Social Connections:   . Frequency of Communication with Friends and Family:   . Frequency of Social Gatherings with Friends and Family:   . Attends Religious Services:   . Active Member of Clubs or Organizations:   . Attends Banker Meetings:   Marland Kitchen Marital Status:    Family History  Problem Relation Age of Onset  . Diabetes Mother    Allergies  Allergen Reactions  . Other     Tree nuts  . Shellfish Allergy    Prior to Admission  medications   Medication Sig Start Date End Date Taking? Authorizing Provider  albuterol (VENTOLIN HFA) 108 (90 Base) MCG/ACT inhaler Inhale 1-2 puffs into the lungs every 6 (six) hours as needed for wheezing or shortness of breath. 10/06/18  Yes Mardella Layman, MD  cloZAPine (CLOZARIL) 100 MG tablet Take 1.5 tablets (150 mg total) by mouth 2 (two) times daily. 09/27/19  Yes Starkes-Perry, Juel Burrow, FNP  diclofenac Sodium (VOLTAREN) 1 % GEL Apply 2 g topically 4 (four) times daily. 10/25/19  Yes Garnette Gunner, MD  lithium 600 MG capsule Take 600 mg by mouth daily.   Yes [provider]  loperamide (IMODIUM) 2 MG capsule TAKE 1 CAPSULE BY MOUTH AS NEEDED FOR FOR DIARRHEA OR LOOSE STOOLS. 08/21/19  Yes Winfrey, Harlen Labs, MD  naproxen (NAPROSYN) 500 MG tablet Take 1 tablet (500 mg total) by mouth 2 (two) times daily with a meal. 10/25/19  Yes Garnette Gunner, MD  valproic acid (DEPAKENE) 250 MG/5ML solution Take 10 mLs by mouth daily. 08/21/19  Yes [provider]  VIIBRYD 40 MG TABS Take 40 mg by mouth daily. 09/24/19  Yes [provider]     Positive ROS: Otherwise  negative  All other systems have been reviewed and were otherwise negative with the exception of those mentioned in the HPI and as above.  Physical Exam: Constitutional: Alert, well-appearing, no acute distress Ears: External ears without lesions or tenderness. Ear canals are clear bilaterally with intact, clear TMs.  Nasal: External nose without lesions.. Clear nasal passages Oral: Lips and gums without lesions. Tongue and palate mucosa without lesions. Posterior oropharynx clear.  Floor mouth is normal to examination. Neck: No palpable adenopathy or masses.  Patient has several small subdermal nodules in the anterior midline neck in the submental region in the region of a short cut hair growth consistent with probable dermal or sebaceous cyst.  There is no palpable adenopathy or masses otherwise. Respiratory:  Breathing comfortably  Skin: No facial/neck lesions or rash noted.  Procedures  Assessment: The small nodules represent dermal cyst or sebaceous cyst.  No evidence of adenopathy or thyroglossal duct cyst.  Plan: Discussed with [place] as well as his mother concerning these being benign sebaceous cyst.  Treatment will consist of just keeping him clean with soap and water.   Narda Bonds, MD   CC:

## 2019-11-18 ENCOUNTER — Encounter: Payer: Self-pay | Admitting: Family Medicine

## 2019-11-18 ENCOUNTER — Ambulatory Visit (INDEPENDENT_AMBULATORY_CARE_PROVIDER_SITE_OTHER): Payer: Medicare HMO | Admitting: Family Medicine

## 2019-11-18 ENCOUNTER — Other Ambulatory Visit: Payer: Self-pay

## 2019-11-18 DIAGNOSIS — M25562 Pain in left knee: Secondary | ICD-10-CM | POA: Diagnosis not present

## 2019-11-18 MED ORDER — NAPROXEN 500 MG PO TABS: 500.0000 mg | ORAL_TABLET | Freq: Two times a day (BID) | ORAL | 0 refills | Status: DC

## 2019-11-18 MED ORDER — DICLOFENAC SODIUM 1 % EX GEL: 2.0000 g | Freq: Four times a day (QID) | CUTANEOUS | 1 refills | Status: AC

## 2019-11-18 NOTE — Progress Notes (Signed)
SUBJECTIVE:   CHIEF COMPLAINT / HPI: Knee Pain  Dylan Flynn is a 29 y.o. male complaining of knee pain.  He indicates this first started in the beginning of June when he was restrained for schizophrenic episode.  Was previously seen for this problem around 1 month ago and was prescribed Naproxen and Voltaren Gel.  Indicates pain has decreased since this time.  Rates pain as a scale of 4 out of 10 and most noticeable when he is bending down or kneeling and rates the pain a 4 out of 10.  Has not had this type of pain before this episode.  He describes the pain as dull and achy.  Denies any swelling, redness, fevers, or radiation of pain.  Indicates the pain is throughout his whole knee and can't point to any particular spot it is worst.    Patient's mother expressed concern regarding tachycardia.  Patient denies any chest pain, heart palpitations, headaches or difficulty breathing.   PERTINENT  PMH / PSH:   OBJECTIVE:   BP 120/62   Pulse (!) 118 Comment: provider informed  Ht 5\' 8"  (1.727 m)   Wt (!) 332 lb (150.6 kg)   SpO2 96%   BMI 50.48 kg/m    Physical Exam Constitutional:      General: He is not in acute distress.    Appearance: Normal appearance.  HENT:     Head: Normocephalic and atraumatic.  Cardiovascular:     Rate and Rhythm: Regular rhythm. Tachycardia present.     Heart sounds: No murmur heard.  No friction rub. No gallop.   Pulmonary:     Effort: Pulmonary effort is normal.     Breath sounds: Normal breath sounds.  Musculoskeletal:        General: No swelling, tenderness or deformity. Normal range of motion.     Right lower leg: No edema.     Left lower leg: No edema.     Comments: ROM normal in both knees.  Minimal amount of swelling in left knee.  No tenderness to palpation.  Mild amount of pain with knee flexion.  Anterior andPosterior drawer test negative.  No pain with Valgus or Varus stress.  No clicks or catches with movement.  Skin:    Findings: No  erythema.  Neurological:     General: No focal deficit present.     Mental Status: He is alert and oriented to person, place, and time.     Motor: No weakness.     Gait: Gait normal.  Psychiatric:        Mood and Affect: Mood normal.        Behavior: Behavior normal.     ASSESSMENT/PLAN:   Acute pain of left knee Likely due to musculoskeletal injury from trauma while in restraints.  Continued Pain may also be due to body Habitus.  X-ray overall normal.  Less likely Ligament or Meniscus tear.   - Refilled prescriptions for Voltaren gel and Naproxen 500mg  BID - Ordered referral for PT, informed patient they will call to set up - Indicated patient should return if swelling greatly increases, pain worsens, or develops fever    Tachycardia Possibly due to underlying anxiety or pain.  Patient denies Chest pain, Palpitations, Headaches, or Difficulty breathing.  Patient BP in normal range.  Less Likely due to Medication side effect, MI, or PE. - Counseled patient and mother on concerning symptoms and schedule appointment or go to ED if any of these develop - Obtain  records from [facility] regarding labs  Jovita Kussmaul, MD [facility]

## 2019-11-18 NOTE — Assessment & Plan Note (Signed)
Likely due to musculoskeletal injury from trauma while in restraints.  Continued Pain may also be due to body Habitus.  X-ray overall normal.  Less likely Ligament or Meniscus tear.   - Refilled prescriptions for Voltaren gel and Naproxen 500mg  BID - Ordered referral for PT, informed patient they will call to set up - Indicated patient should return if swelling greatly increases, pain worsens, or develops fever

## 2019-11-18 NOTE — Patient Instructions (Signed)
It was good to see you today.  Thank you for coming in.  I think you have Musculoskeletal knee pain.      I am refilling your naproxen and voltaren gel.  I am also putting a referal into Physical Therapy for your knee pain.  You should be better in 1 month.   If you are not better by then or if you have worsening pain, fever, excessive swelling then come back to see [place].   Be Well, Jovita Kussmaul MD

## 2019-11-21 ENCOUNTER — Other Ambulatory Visit: Payer: Self-pay

## 2019-11-21 ENCOUNTER — Ambulatory Visit: Payer: Medicare HMO | Attending: Family Medicine | Admitting: Physical Therapy

## 2019-11-21 DIAGNOSIS — M25562 Pain in left knee: Secondary | ICD-10-CM

## 2019-11-21 DIAGNOSIS — M6281 Muscle weakness (generalized): Secondary | ICD-10-CM

## 2019-11-21 DIAGNOSIS — R600 Localized edema: Secondary | ICD-10-CM | POA: Diagnosis present

## 2019-11-21 NOTE — Patient Instructions (Signed)
Access Code: [email or web address] URL: [email or web address] Date: 11/21/2019 Prepared by: Rosana Hoes  Exercises Supine Active Straight Leg Raise - 4 x weekly - 2 sets - 10 reps Supine Bridge - 4 x weekly - 2 sets - 10 reps Sidelying Hip Abduction - 4 x weekly - 2 sets - 10 reps Seated Knee Extension with Resistance - 4 x weekly - 2 sets - 15 reps Standing Hamstring Curl with Resistance - 4 x weekly - 2 sets - 15 reps Wall Squat - 4 x weekly - 2 sets - 10 reps

## 2019-11-22 ENCOUNTER — Encounter: Payer: Self-pay | Admitting: Physical Therapy

## 2019-11-22 NOTE — Therapy (Addendum)
[place], [place], [address] Phone: [phone]   Fax:  [phone]  Physical Therapy Evaluation / Discharge  Patient Details  Name: Dylan Flynn MRN: [id number] Date of Birth: 09-May-1990 Referring Provider (PT): Kinnie Feil, MD   Encounter Date: 11/21/2019   PT End of Session - 11/22/19 0805    Visit Number 1    Number of Visits 3    Date for PT Re-Evaluation 01/16/20    Authorization Type [employer] MEDICARE    PT Start Time 1530    PT Stop Time 1615    PT Time Calculation (min) 45 min    Activity Tolerance Patient tolerated treatment well    Behavior During Therapy [facility] for tasks assessed/performed           Past Medical History:  Diagnosis Date  . Asthma   . Schizophrenia ([place])   . Seizures ([place])     History reviewed. No pertinent surgical history.  There were no vitals filed for this visit.    Subjective Assessment - 11/21/19 1534    Subjective Patient reports left knee pain, no specific location, that occurs when he squats, crouches, or kneels. States it happened when he banged it up a few months ago. Also notes that when he straightens his knee all the way when he is standing his knee will occasionally buckle and feel unreliable. He will have some stiffness if he has been sitting for extended periods. Denies any clickng/popping/catching of the knee.    Limitations Lifting;Standing;Walking;House hold activities;Sitting    How long can you sit comfortably? No limitation    How long can you stand comfortably? No limitation    How long can you walk comfortably? No limitation    Diagnostic tests X-ray    Patient Stated Goals Get back to previous activity level    Currently in Pain? No/denies    Pain Score 0-No pain    Pain Location Knee    Pain Orientation Left    Pain Descriptors / Indicators Dull    Pain Type Acute pain    Pain Onset More than a month ago    Pain Frequency  Intermittent    Aggravating Factors  Squatting, kneeling, crouching    Pain Relieving Factors Gel to rub on knee, pain killers    Effect of Pain on Daily Activities Patient feels limited in exercise and activity level              [facility] PT Assessment - 11/22/19 0001      Assessment   Medical Diagnosis Left knee pain    Referring Provider (PT) Kinnie Feil, MD    Onset Date/Surgical Date 10/05/19    Hand Dominance Right    Next MD Visit Not scheduled    Prior Therapy None      Precautions   Precautions None      Restrictions   Weight Bearing Restrictions No      Balance Screen   Has the patient fallen in the past 6 months No      [place] residence    Type of [place] to enter    Entrance Stairs-Number of Steps 1 staircase    Entrance Stairs-Rails Right;Left      Prior Function   Level of Independence Independent    Vocation Unemployed    Leisure Exercise      Cognition   Overall  Cognitive Status Within Functional Limits for tasks assessed      Observation/Other Assessments   Observations Patient appears in no apparent distress    Focus on Therapeutic Outcomes (FOTO)  28% limitation      Observation/Other Assessments-Edema    Edema --   Possible slight left knee effusion     Sensation   Light Touch Appears Intact      Coordination   Gross Motor Movements are Fluid and Coordinated Yes      Functional Tests   Functional tests Squat      Squat   Comments Slight weight shift away from left side      ROM / Strength   AROM / PROM / Strength AROM;Strength      AROM   AROM Assessment Site Knee    Right/Left Knee Right;Left    Right Knee Extension 0    Right Knee Flexion 125    Left Knee Extension 0    Left Knee Flexion 125      Strength   Strength Assessment Site Hip;Knee    Right/Left Hip Right;Left    Right Hip Flexion 5/5    Right Hip Extension 4+/5    Right Hip ABduction 4/5     Left Hip Flexion 5/5    Left Hip Extension 4/5    Left Hip ABduction 4-/5    Right/Left Knee Right;Left    Right Knee Flexion 5/5    Right Knee Extension 5/5    Left Knee Flexion 4+/5    Left Knee Extension 4+/5      Palpation   Patella mobility WFL and non-painful    Palpation comment Non-TTP      Special Tests   Other special tests All meniscal, ligamentous stability, and patellofemoral testing negative      Transfers   Transfers Independent with all Transfers      Ambulation/Gait   Gait Pattern Within Functional Limits                      Objective measurements completed on examination: See above findings.       [facility] Adult PT Treatment/Exercise - 11/22/19 0001      Exercises   Exercises Knee/Hip      Knee/Hip Exercises: Standing   Knee Flexion 15 reps    Knee Flexion Limitations red band    Wall Squat 10 reps;3 seconds    Wall Squat Limitations patient cued to avoid weight shift      Knee/Hip Exercises: Seated   Long Arc Quad 15 reps    Long Arc Quad Limitations red band      Knee/Hip Exercises: Supine   Bridges 10 reps    Straight Leg Raises 10 reps      Knee/Hip Exercises: Sidelying   Hip ABduction 10 reps                  PT Education - 11/21/19 1624    Education Details Exam findings, POC, HEP    Person(s) Educated Patient    Methods Explanation;Demonstration;Tactile cues;Verbal cues;Handout    Comprehension Verbalized understanding;Returned demonstration;Verbal cues required;Tactile cues required;Need further instruction            PT Short Term Goals - 11/22/19 0813      PT SHORT TERM GOAL #1   Title STG=LTG             PT Long Term Goals - 11/22/19 0813      PT LONG TERM  GOAL #1   Title Patient will be I with final HEP to maintain progress from PT    Time 8    Period Weeks    Status New    Target Date 01/16/20      PT LONG TERM GOAL #2   Title Patient will report improved functional ability on FOTO to  </= 17% limitation    Time 8    Period Weeks    Status New    Target Date 01/16/20      PT LONG TERM GOAL #3   Title Patient will return to running and exercise with no pain or limitation    Time 8    Period Weeks    Status New    Target Date 01/16/20      PT LONG TERM GOAL #4   Title Patient will report no episodes of buckling of left knee to demonstrate improved strength and stability    Time 8    Period Weeks    Status New    Target Date 01/16/20                  Plan - 11/22/19 0806    Clinical Impression Statement Patient presents to PT with report of acute knee pain. Currently he does not exhibit any signs or symptoms of ligamentous or meniscal injury. Possible trace amounts of swelling and strength deficit of left knee. Patient did not report any pain this visit. He was provided exercises to progress left knee strength and he would benefit from continued skilled PT to improve strength and feeling of stability to return to previous level of activity.    Personal Factors and Comorbidities Fitness;Comorbidity 2;Behavior Pattern    Comorbidities BMI, hx of schizophrenia    Examination-Activity Limitations Locomotion Level;Squat;Stairs;Stand;Lift    Examination-Participation Restrictions Community Activity    Stability/Clinical Decision Making Stable/Uncomplicated    Clinical Decision Making Low    Rehab Potential Good    PT Frequency Monthy    PT Duration 8 weeks    PT Treatment/Interventions ADLs/Self Care Home Management;Cryotherapy;Electrical Stimulation;Iontophoresis 33m/ml Dexamethasone;Moist Heat;Neuromuscular re-education;Balance training;Therapeutic exercise;Therapeutic activities;Functional mobility training;Stair training;Gait training;Patient/family education;Manual techniques;Dry needling;Passive range of motion;Taping;Spinal Manipulations;Joint Manipulations;Vasopneumatic Device    PT Next Visit Plan Assess HEP and progress PRN, progress left knee/hip  strengthening, incorporate SL stability exercises, vaso PRN    PT Home Exercise Plan [email or web address]: SLR, bridge, sidelying hip abduction, seated LAQ with red, standing hamstring curl with red, wall squat    Consulted and Agree with Plan of Care Patient           Patient will benefit from skilled therapeutic intervention in order to improve the following deficits and impairments:  Decreased strength, Pain, Decreased activity tolerance  Visit Diagnosis: Acute pain of left knee  Muscle weakness (generalized)  Localized edema     Problem List Patient Active Problem List   Diagnosis Date Noted  . Acute pain of left knee 10/25/2019  . Suicidal ideation   . Localized swelling, mass and lump, neck 08/26/2019  . Intertrigo 02/04/2019  . Healthcare maintenance 02/04/2019  . Cellulitis of head except face 12/13/2018  . Asthma   . Schizophrenia ([place]   . Diarrhea due to drug     CHilda Blades PT, DPT, LAT, ATC 11/22/19  8:23 AM Phone: [phone]: 3[facility] [place] [address]Phone: [phone]  Fax:  [phone] Name: SDamarea MerkelMRN: [id number] of  Birth: 10/24/1990    PHYSICAL THERAPY DISCHARGE SUMMARY  Visits from Start of Care: 1  Current functional level related to goals / functional outcomes: See above, patient did not return to therapy   Remaining deficits: See above, patient did not return to therapy   Education / Equipment: HEP Plan: Patient agrees to discharge.  Patient goals were not met. Patient is being discharged due to                                                     ?????   Hilda Blades, PT, DPT, LAT, ATC 12/19/19  4:44 PM Phone: [phone] Fax: [phone]

## 2019-12-06 ENCOUNTER — Encounter (HOSPITAL_COMMUNITY): Payer: Self-pay

## 2019-12-06 ENCOUNTER — Other Ambulatory Visit: Payer: Self-pay

## 2019-12-06 ENCOUNTER — Ambulatory Visit (HOSPITAL_COMMUNITY)
Admission: EM | Admit: 2019-12-06 | Discharge: 2019-12-07 | Disposition: A | Discharge status: Home / Self Care | Payer: Medicare HMO | Attending: Nurse Practitioner | Admitting: Nurse Practitioner

## 2019-12-06 DIAGNOSIS — F209 Schizophrenia, unspecified: Secondary | ICD-10-CM | POA: Insufficient documentation

## 2019-12-06 DIAGNOSIS — Z791 Long term (current) use of non-steroidal anti-inflammatories (NSAID): Secondary | ICD-10-CM | POA: Insufficient documentation

## 2019-12-06 DIAGNOSIS — Z20822 Contact with and (suspected) exposure to covid-19: Secondary | ICD-10-CM | POA: Insufficient documentation

## 2019-12-06 DIAGNOSIS — R45851 Suicidal ideations: Secondary | ICD-10-CM | POA: Diagnosis not present

## 2019-12-06 DIAGNOSIS — Z79899 Other long term (current) drug therapy: Secondary | ICD-10-CM | POA: Diagnosis not present

## 2019-12-06 DIAGNOSIS — J45909 Unspecified asthma, uncomplicated: Secondary | ICD-10-CM | POA: Diagnosis not present

## 2019-12-06 LAB — GLUCOSE, CAPILLARY: Glucose-Capillary: 110 mg/dL — ABNORMAL HIGH (ref 70–99)

## 2019-12-06 MED ORDER — OLANZAPINE 10 MG PO TBDP: 10.0000 mg | ORAL_TABLET | Freq: Once | ORAL | Status: AC

## 2019-12-06 MED ORDER — LORAZEPAM 1 MG PO TABS
ORAL_TABLET | ORAL | Status: AC
  Administered 2019-12-06: 2 mg via ORAL
  Filled 2019-12-06: qty 2

## 2019-12-06 MED ORDER — OLANZAPINE 10 MG PO TBDP
ORAL_TABLET | ORAL | Status: AC
  Administered 2019-12-06: 10 mg via ORAL
  Filled 2019-12-06: qty 1

## 2019-12-06 MED ORDER — LORAZEPAM 1 MG PO TABS: 2.0000 mg | ORAL_TABLET | Freq: Once | ORAL | Status: AC

## 2019-12-06 NOTE — ED Triage Notes (Signed)
Pt reports 'hearing voices'. A&O, calm and cooperative with meds & labs.

## 2019-12-07 DIAGNOSIS — F209 Schizophrenia, unspecified: Secondary | ICD-10-CM | POA: Diagnosis not present

## 2019-12-07 LAB — CBC WITH DIFFERENTIAL/PLATELET
Abs Immature Granulocytes: 0.05 10*3/uL (ref 0.00–0.07)
Basophils Absolute: 0 10*3/uL (ref 0.0–0.1)
Basophils Relative: 0 %
Eosinophils Absolute: 0.2 10*3/uL (ref 0.0–0.5)
Eosinophils Relative: 2 %
HCT: 43.1 % (ref 39.0–52.0)
Hemoglobin: 13.5 g/dL (ref 13.0–17.0)
Immature Granulocytes: 1 %
Lymphocytes Relative: 23 %
Lymphs Abs: 2.4 10*3/uL (ref 0.7–4.0)
MCH: 24.8 pg — ABNORMAL LOW (ref 26.0–34.0)
MCHC: 31.3 g/dL (ref 30.0–36.0)
MCV: 79.1 fL — ABNORMAL LOW (ref 80.0–100.0)
Monocytes Absolute: 0.7 10*3/uL (ref 0.1–1.0)
Monocytes Relative: 6 %
Neutro Abs: 7.4 10*3/uL (ref 1.7–7.7)
Neutrophils Relative %: 68 %
Platelets: 309 10*3/uL (ref 150–400)
RBC: 5.45 MIL/uL (ref 4.22–5.81)
RDW: 15.7 % — ABNORMAL HIGH (ref 11.5–15.5)
WBC: 10.8 10*3/uL — ABNORMAL HIGH (ref 4.0–10.5)
nRBC: 0 % (ref 0.0–0.2)

## 2019-12-07 LAB — LIPID PANEL
Cholesterol: 213 mg/dL — ABNORMAL HIGH (ref 0–200)
HDL: 35 mg/dL — ABNORMAL LOW (ref >40)
LDL Cholesterol: 160 mg/dL — ABNORMAL HIGH (ref 0–99)
Total CHOL/HDL Ratio: 6.1 RATIO
Triglycerides: 91 mg/dL (ref <150)
VLDL: 18 mg/dL (ref 0–40)

## 2019-12-07 LAB — COMPREHENSIVE METABOLIC PANEL
ALT: 71 U/L — ABNORMAL HIGH (ref 0–44)
AST: 33 U/L (ref 15–41)
Albumin: 3.5 g/dL (ref 3.5–5.0)
Alkaline Phosphatase: 143 U/L — ABNORMAL HIGH (ref 38–126)
Anion gap: 13 (ref 5–15)
BUN: 9 mg/dL (ref 6–20)
CO2: 21 mmol/L — ABNORMAL LOW (ref 22–32)
Calcium: 9.3 mg/dL (ref 8.9–10.3)
Chloride: 103 mmol/L (ref 98–111)
Creatinine, Ser: 0.88 mg/dL (ref 0.61–1.24)
GFR calc Af Amer: >60 mL/min (ref >60)
GFR calc non Af Amer: >60 mL/min (ref >60)
Glucose, Bld: 92 mg/dL (ref 70–99)
Potassium: 3.7 mmol/L (ref 3.5–5.1)
Sodium: 137 mmol/L (ref 135–145)
Total Bilirubin: 0.5 mg/dL (ref 0.3–1.2)
Total Protein: 7.7 g/dL (ref 6.5–8.1)

## 2019-12-07 LAB — VALPROIC ACID LEVEL: Valproic Acid Lvl: 21 ug/mL — ABNORMAL LOW (ref 50.0–100.0)

## 2019-12-07 LAB — POC SARS CORONAVIRUS 2 AG -  ED: SARS Coronavirus 2 Ag: NEGATIVE

## 2019-12-07 LAB — POCT URINE DRUG SCREEN - MANUAL ENTRY (I-SCREEN)
POC Amphetamine UR: NOT DETECTED
POC Buprenorphine (BUP): NOT DETECTED
POC Cocaine UR: NOT DETECTED
POC Marijuana UR: NOT DETECTED
POC Methadone UR: NOT DETECTED
POC Methamphetamine UR: NOT DETECTED
POC Morphine: NOT DETECTED
POC Oxazepam (BZO): NOT DETECTED
POC Oxycodone UR: NOT DETECTED
POC Secobarbital (BAR): NOT DETECTED

## 2019-12-07 LAB — T4, FREE: Free T4: 0.89 ng/dL (ref 0.61–1.12)

## 2019-12-07 LAB — SARS CORONAVIRUS 2 BY RT PCR (HOSPITAL ORDER, PERFORMED IN ~~LOC~~ HOSPITAL LAB): SARS Coronavirus 2: NEGATIVE

## 2019-12-07 LAB — HEMOGLOBIN A1C
Hgb A1c MFr Bld: 5.6 % (ref 4.8–5.6)
Mean Plasma Glucose: 114.02 mg/dL

## 2019-12-07 LAB — TSH: TSH: 13.292 u[IU]/mL — ABNORMAL HIGH (ref 0.350–4.500)

## 2019-12-07 LAB — POC SARS CORONAVIRUS 2 AG: SARS Coronavirus 2 Ag: NEGATIVE

## 2019-12-07 LAB — LITHIUM LEVEL: Lithium Lvl: 0.37 mmol/L — ABNORMAL LOW (ref 0.60–1.20)

## 2019-12-07 MED ORDER — ALUM & MAG HYDROXIDE-SIMETH 200-200-20 MG/5ML PO SUSP: 30.0000 mL | ORAL | Status: DC | PRN

## 2019-12-07 MED ORDER — VALPROIC ACID 250 MG/5ML PO SOLN
500.0000 mg | Freq: Every day | ORAL | Status: DC
  Administered 2019-12-07: 500 mg via ORAL
  Filled 2019-12-07: qty 10

## 2019-12-07 MED ORDER — CLOZAPINE 100 MG PO TABS
100.0000 mg | ORAL_TABLET | Freq: Every day | ORAL | Status: DC
  Administered 2019-12-07: 100 mg via ORAL
  Filled 2019-12-07: qty 1

## 2019-12-07 MED ORDER — MAGNESIUM HYDROXIDE 400 MG/5ML PO SUSP: 30.0000 mL | Freq: Every day | ORAL | Status: DC | PRN

## 2019-12-07 MED ORDER — ALBUTEROL SULFATE HFA 108 (90 BASE) MCG/ACT IN AERS: 1.0000 | INHALATION_SPRAY | Freq: Four times a day (QID) | RESPIRATORY_TRACT | Status: DC | PRN

## 2019-12-07 MED ORDER — CLOZAPINE 100 MG PO TABS: 300.0000 mg | ORAL_TABLET | Freq: Every day | ORAL | Status: DC

## 2019-12-07 MED ORDER — ACETAMINOPHEN 325 MG PO TABS: 650.0000 mg | ORAL_TABLET | Freq: Four times a day (QID) | ORAL | Status: DC | PRN

## 2019-12-07 MED ORDER — LORAZEPAM 1 MG PO TABS: 1.0000 mg | ORAL_TABLET | ORAL | Status: DC | PRN

## 2019-12-07 MED ORDER — LITHIUM CARBONATE ER 450 MG PO TBCR: 900.0000 mg | EXTENDED_RELEASE_TABLET | Freq: Every day | ORAL | Status: DC

## 2019-12-07 MED ORDER — VALPROIC ACID 250 MG/5ML PO SOLN: 500.0000 mg | Freq: Two times a day (BID) | ORAL | Status: DC

## 2019-12-07 MED ORDER — OLANZAPINE 10 MG PO TBDP
10.0000 mg | ORAL_TABLET | Freq: Three times a day (TID) | ORAL | Status: DC | PRN
  Administered 2019-12-07: 10 mg via ORAL
  Filled 2019-12-07: qty 1

## 2019-12-07 MED ORDER — OLANZAPINE 5 MG PO TABS
5.0000 mg | ORAL_TABLET | Freq: Every day | ORAL | 0 refills | Status: AC | PRN
Start: 2019-12-07 — End: 2020-12-06

## 2019-12-07 MED ORDER — VALPROIC ACID 250 MG PO CAPS
500.0000 mg | ORAL_CAPSULE | Freq: Every day | ORAL | Status: DC
  Filled 2019-12-07: qty 2

## 2019-12-07 MED ORDER — VALPROIC ACID 250 MG PO CAPS: 500.0000 mg | ORAL_CAPSULE | Freq: Two times a day (BID) | ORAL | 0 refills | Status: AC

## 2019-12-07 NOTE — BH Assessment (Signed)
Comprehensive Clinical Assessment (CCA) Screening, Triage and Referral Note  12/07/2019 Dylan Flynn [id number]   Patient presenting to [facility] as a walk-in with complaint of psychosis. While this TTS clinician was pulling up patient information in medical record. Patient came into clinicians office upset, stating "I am going to sue, I am going to sue, I feel unsafe, I am going to hurt myself, I am going to hurt somebody right now, I am going to tear this place up, I feel like tearing something up". Clinician talked patient into walking out of office and into hallway. TTS clinician and NT were in hallway with patient, whom then stated again that he was going to hurt himself and hurt someone and then he was going to tear the place up "do you hear that, do you hear that". Clinician notified sheriff/security and Dylan Conn, NP. TTS clinician unable to complete rest of assessment due to patients current mental health status.   Disposition Dylan Conn, NP, recommends continual observation at [facility].   Visit Diagnosis: No diagnosis found.  Patient Reported Information How did you hear about [place]? Family/Friend   Referral name: Dylan Flynn   Referral phone number: [phone]  Whom do you see for routine medical problems? No data recorded  Practice/Facility Name: No data recorded  Practice/Facility Phone Number: No data recorded  Name of Contact: No data recorded  Contact Number: No data recorded  Contact Fax Number: No data recorded  Prescriber Name: No data recorded  Prescriber Address (if known): No data recorded What Is the Reason for Your Visit/Call Today? No data recorded How Long Has This Been Causing You Problems? No data recorded Have You Recently Been in Any Inpatient Treatment (Hospital/Detox/Crisis Center/28-Day Program)? No data recorded  Name/Location of Program/Hospital:No data recorded  How Long Were You There? No data recorded  When Were You Discharged? No data recorded Have You  Ever Received Services From [facility] Before? No data recorded  Who Do You See at [facility]? No data recorded Have You Recently Had Any Thoughts About Hurting Yourself? Yes   Are You Planning to Commit Suicide/Harm Yourself At This time?  Yes  Have you Recently Had Thoughts About Hurting Someone Dylan Flynn? Yes   Explanation: no specific person identified  Have You Used Any Alcohol or Drugs in the Past 24 Hours? No data recorded  How Long Ago Did You Use Drugs or Alcohol?  No data recorded  What Did You Use and How Much? No data recorded What Do You Feel Would Help You the Most Today? No data recorded Do You Currently Have a Therapist/Psychiatrist? No data recorded  Name of Therapist/Psychiatrist: No data recorded  Have You Been Recently Discharged From Any Office Practice or Programs? No data recorded  Explanation of Discharge From Practice/Program:  No data recorded    CCA Screening Triage Referral Assessment Type of Contact: Face-to-Face   Is this Initial or Reassessment? No data recorded  Date Telepsych consult ordered in [facility]:  10/05/19   Time Telepsych consult ordered in [facility]:  2330  Patient Reported Information Reviewed? Yes   Patient Left Without Being Seen? No data recorded  Reason for Not Completing Assessment: No data recorded Collateral Involvement: Dylan Flynn, mother, [phone]  Does Patient Have a Court Appointed Legal Guardian? No data recorded  Name and Contact of Legal Guardian:  Self  If Minor and Not Living with Parent(s), Who has Custody? N/A  Is CPS involved or ever been involved? Never  Is APS involved or  ever been involved? Never  Patient Determined To Be At Risk for Harm To Self or Others Based on Review of Patient Reported Information or Presenting Complaint? Yes, for Self-Harm   Method: No data recorded  Availability of Means: No data recorded  Intent: No data recorded  Notification Required: No data recorded  Additional Information for  Danger to Others Potential:  No data recorded  Additional Comments for Danger to Others Potential:  No data recorded  Are There Guns or Other Weapons in Your Home?  No data recorded   Types of Guns/Weapons: No data recorded   Are These Weapons Safely Secured?                              No data recorded   Who Could Verify You Are Able To Have These Secured:    No data recorded Do You Have any Outstanding Charges, Pending Court Dates, Parole/Probation? No data recorded Contacted To Inform of Risk of Harm To Self or Others: Family/Significant Other:  Location of Assessment: [facility] Services  Does Patient Present under Involuntary Commitment? No   IVC Papers Initial File Date: No data recorded  [place] of Residence: [place]  Patient Currently Receiving the Following Services: Medication Management   Determination of Need: Emergent (2 hours)   Options For Referral: Inpatient Hospitalization   Dylan Flynn, [facility]

## 2019-12-07 NOTE — ED Provider Notes (Signed)
Behavioral Health Admission H&P Dylan Flynn & OBS)  Date: 12/07/19 Patient Name: Dylan Flynn MRN: [id number] Chief Complaint:  Chief Complaint  Patient presents with  . Hallucinations    voices      Diagnoses:  Final diagnoses:  Schizophrenia, unspecified type (HCC)    Dylan:WJXB Flynn is a 29 y.o. male who presents voluntarily to [facility] due to suicidal thoughts and voices telling him to kill himself.  Shortly after arrival, patient walking to TTS office and stated to clinician "I am going to sue, I am going to sue, I feel unsafe, I am going to hurt myself, I am going to hurt somebody right now, I am going to tear this place up, I feel like tearing something up." Patient made similar comments during a stay in the observation unit at [facility] in June 2021 and shortly after making the comments at that time he significantly damaged property at [facility]. He was not aggressive towards staff during that admission. Patient endorses suicidal thoughts, denies any intent or plan. He denies homicidal ideations. He states that voices are telling him to kill himself. Patient is requesting medication as soon as possible to help with the voices. Placed orders for Zydis 10 mg and ativan 2 mg PO. Shortly after receiving the medications the patient appeared less anxious and was cooperative with the admission process.   Patient reports that he has been taking his medications as prescribed. States that he took his night time medications prior to arrival at [facility].   Contacted patient's mother, Dylan Flynn [phone], for collateral. Patient's mother reports that patient requested for her to bring him in for an evaluation due to suicidal thoughts and auditory hallucinations. She states that he has not had any aggressive behavior. She states that the patient takes Lithium 900 mg QHS, Depakote 500 mg Daily, and Clozaril 100 mg in the morning and Clozaril 300 mg QHS. She reports that the patient has been compliant with medications  and that they observe him taking his medications.  PHQ 2-9:    Office Visit from 11/18/2019 in [facility]  Thoughts that you would be better off dead, or of hurting yourself in some way Not at all  PHQ-9 Total Score 0        ED from 12/06/2019 in [facility] ED from 10/06/2019 in [facility]GENCY [facility] ED from 09/21/2019 in [facility]  C-SSRS RISK CATEGORY No Risk No Risk No Risk       Total Time spent with patient: 30 minutes  Musculoskeletal  Strength & Muscle Tone: within normal limits Gait & Station: normal Patient leans: N/A  Psychiatric Specialty Exam  Presentation General Appearance: Casual;Fairly Groomed  Eye Contact:Minimal  Speech:Clear and Coherent;Normal Rate  Speech Volume:Normal  Handedness:No data recorded  Mood and Affect  Mood:Anxious;Depressed;Hopeless;Irritable  Affect:Flat   Thought Process  Thought Processes:Coherent;Linear  Descriptions of Associations:Intact  Orientation:Full (Time, Place and Person)  Thought Content:Logical  Hallucinations:Hallucinations: Auditory Description of Auditory Hallucinations: reports hearing voices that tell him to kill himself  Ideas of Reference:None  Suicidal Thoughts:Suicidal Thoughts: Yes, Active SI Active Intent and/or Plan: Without Intent;Without Plan  Homicidal Thoughts:Homicidal Thoughts: No   Sensorium  Memory:Immediate Good;Recent Good;Remote Fair  Judgment:Intact  Insight:Fair   Executive Functions  Concentration:Fair  Attention Span:Fair  Recall:Fair  Fund of Knowledge:Good  Language:Good   Psychomotor Activity  Psychomotor Activity:Psychomotor Activity: Restlessness   Assets  Assets:Communication Skills;Desire for Improvement;Financial Resources/Insurance;Housing;Physical Health;Resilience;Social Support  Sleep  Sleep:Sleep: Fair   Physical  Exam Constitutional:      General: He is not in acute distress.    Appearance: He is not ill-appearing, toxic-appearing or diaphoretic.  HENT:     Head: Normocephalic.     Right Ear: External ear normal.     Left Ear: External ear normal.  Eyes:     Pupils: Pupils are equal, round, and reactive to light.  Cardiovascular:     Rate and Rhythm: Normal rate.  Pulmonary:     Effort: Pulmonary effort is normal. No respiratory distress.  Musculoskeletal:        General: Normal range of motion.  Skin:    General: Skin is warm and dry.  Neurological:     Mental Status: He is alert and oriented to person, place, and time.  Psychiatric:        Mood and Affect: Mood is anxious and depressed.        Speech: Speech normal.        Behavior: Behavior is cooperative.        Thought Content: Thought content is not paranoid or delusional. Thought content includes suicidal ideation. Thought content does not include homicidal ideation. Thought content does not include suicidal plan.    Review of Systems  Constitutional: Negative for chills, diaphoresis, fever, malaise/fatigue and weight loss.  HENT: Negative for congestion.   Respiratory: Negative for cough and shortness of breath.   Cardiovascular: Negative for chest pain and palpitations.  Gastrointestinal: Negative for diarrhea, nausea and vomiting.  Neurological: Negative for dizziness and seizures.  Psychiatric/Behavioral: Positive for depression, hallucinations and suicidal ideas. Negative for memory loss and substance abuse. The patient is nervous/anxious and has insomnia.   All other systems reviewed and are negative.   Blood pressure (!) 139/94, pulse (!) 126, temperature 98.4 F (36.9 C), temperature source Oral, resp. rate 20, SpO2 98 %. There is no height or weight on file to calculate BMI.  Past Psychiatric History: Schizophrenia. Inpatient at [facility] 10/2019.  Is the patient at risk to self? Yes  Has the patient been a risk to  self in the past 6 months? Yes .    Has the patient been a risk to self within the distant past? Yes   Is the patient a risk to others? Yes   Has the patient been a risk to others in the past 6 months? Yes   Has the patient been a risk to others within the distant past? Yes   Past Medical History:  Past Medical History:  Diagnosis Date  . Asthma   . Schizophrenia (HCC)   . Seizures (HCC)    History reviewed. No pertinent surgical history.  Family History:  Family History  Problem Relation Age of Onset  . Diabetes Mother     Social History:  Social History   Socioeconomic History  . Marital status: Single    Spouse name: Not on file  . Number of children: Not on file  . Years of education: Not on file  . Highest education level: Not on file  Occupational History  . Not on file  Tobacco Use  . Smoking status: Never Smoker  . Smokeless tobacco: Never Used  Vaping Use  . Vaping Use: Never used  Substance and Sexual Activity  . Alcohol use: Never  . Drug use: Never  . Sexual activity: Never  Other Topics Concern  . Not on file  Social History Narrative  . Not on file  Social Determinants of Health   Financial Resource Strain:   . Difficulty of Paying Living Expenses:   Food Insecurity:   . Worried About Programme researcher, broadcasting/film/videounning Out of Food in the Last Year:   . Baristaan Out of Food in the Last Year:   Transportation Needs:   . Freight forwarderLack of Transportation (Medical):   Marland Kitchen. Lack of Transportation (Non-Medical):   Physical Activity:   . Days of Exercise per Week:   . Minutes of Exercise per Session:   Stress:   . Feeling of Stress :   Social Connections:   . Frequency of Communication with Friends and Family:   . Frequency of Social Gatherings with Friends and Family:   . Attends Religious Services:   . Active Member of Clubs or Organizations:   . Attends BankerClub or Organization Meetings:   Marland Kitchen. Marital Status:   Intimate Partner Violence:   . Fear of Current or Ex-Partner:   . Emotionally  Abused:   Marland Kitchen. Physically Abused:   . Sexually Abused:     SDOH:  SDOH Screenings   Alcohol Screen:   . Last Alcohol Screening Score (AUDIT):   Depression (PHQ2-9): Low Risk   . PHQ-2 Score: 0  Financial Resource Strain:   . Difficulty of Paying Living Expenses:   Food Insecurity:   . Worried About Programme researcher, broadcasting/film/videounning Out of Food in the Last Year:   . [employer] Financialan Out of Food in the Last Year:   Housing:   . Last Housing Risk Score:   Physical Activity:   . Days of Exercise per Week:   . Minutes of Exercise per Session:   Social Connections:   . Frequency of Communication with Friends and Family:   . Frequency of Social Gatherings with Friends and Family:   . Attends Religious Services:   . Active Member of Clubs or Organizations:   . Attends BankerClub or Organization Meetings:   Marland Kitchen. Marital Status:   Stress:   . Feeling of Stress :   Tobacco Use: Low Risk   . Smoking Tobacco Use: Never Smoker  . Smokeless Tobacco Use: Never Used  Transportation Needs:   . Freight forwarderLack of Transportation (Medical):   Marland Kitchen. Lack of Transportation (Non-Medical):     Last Labs:  Admission on 12/06/2019  Component Date Value Ref Range Status  . SARS Coronavirus 2 Ag 12/06/2019 Negative  Negative Final  . SARS Coronavirus 2 12/06/2019 NEGATIVE  NEGATIVE Final   Comment: (NOTE) SARS-CoV-2 target nucleic acids are NOT DETECTED.  The SARS-CoV-2 RNA is generally detectable in upper and lower respiratory specimens during the acute phase of infection. The lowest concentration of SARS-CoV-2 viral copies this assay can detect is 250 copies / mL. A negative result does not preclude SARS-CoV-2 infection and should not be used as the sole basis for treatment or other patient management decisions.  A negative result may occur with improper specimen collection / handling, submission of specimen other than nasopharyngeal swab, presence of viral mutation(s) within the areas targeted by this assay, and inadequate number of viral copies (<250  copies / mL). A negative result must be combined with clinical observations, patient history, and epidemiological information.  Fact Sheet for Patients:   BoilerBrush.com.cyhttps://www.fda.gov/media/136312/download  Fact Sheet for Healthcare Providers: https://pope.com/https://www.fda.gov/media/136313/download  This test is not yet approved or                           cleared by the [place] FDA and has been authorized  for detection and/or diagnosis of SARS-CoV-2 by FDA under an Emergency Use Authorization (EUA).  This EUA will remain in effect (meaning this test can be used) for the duration of the COVID-19 declaration under Section 564(b)(1) of the Act, 21 U.S.C. section 360bbb-3(b)(1), unless the authorization is terminated or revoked sooner.  Performed at [facility], [address], [place], [place] [address]   . WBC 12/07/2019 10.8* 4.0 - 10.5 K/uL Final  . RBC 12/07/2019 5.45  4.22 - 5.81 MIL/uL Final  . Hemoglobin 12/07/2019 13.5  13.0 - 17.0 g/dL Final  . HCT 60/45/4098 43.1  39 - 52 % Final  . MCV 12/07/2019 79.1* 80.0 - 100.0 fL Final  . MCH 12/07/2019 24.8* 26.0 - 34.0 pg Final  . MCHC 12/07/2019 31.3  30.0 - 36.0 g/dL Final  . RDW 11/91/4782 15.7* 11.5 - 15.5 % Final  . Platelets 12/07/2019 309  150 - 400 K/uL Final  . nRBC 12/07/2019 0.0  0.0 - 0.2 % Final  . Neutrophils Relative % 12/07/2019 68  % Final  . Neutro Abs 12/07/2019 7.4  1.7 - 7.7 K/uL Final  . Lymphocytes Relative 12/07/2019 23  % Final  . Lymphs Abs 12/07/2019 2.4  0.7 - 4.0 K/uL Final  . Monocytes Relative 12/07/2019 6  % Final  . Monocytes Absolute 12/07/2019 0.7  0 - 1 K/uL Final  . Eosinophils Relative 12/07/2019 2  % Final  . Eosinophils Absolute 12/07/2019 0.2  0 - 0 K/uL Final  . Basophils Relative 12/07/2019 0  % Final  . Basophils Absolute 12/07/2019 0.0  0 - 0 K/uL Final  . Immature Granulocytes 12/07/2019 1  % Final  . Abs Immature Granulocytes 12/07/2019 0.05  0.00 - 0.07 K/uL Final   Performed at [facility], [address], [place], [place] [address]  . Sodium 12/07/2019 137  135 - 145 mmol/L Final  . Potassium 12/07/2019 3.7  3.5 - 5.1 mmol/L Final  . Chloride 12/07/2019 103  98 - 111 mmol/L Final  . CO2 12/07/2019 21* 22 - 32 mmol/L Final  . Glucose, Bld 12/07/2019 92  70 - 99 mg/dL Final   Glucose reference range applies only to samples taken after fasting for at least 8 hours.  . BUN 12/07/2019 9  6 - 20 mg/dL Final  . Creatinine, Ser 12/07/2019 0.88  0.61 - 1.24 mg/dL Final  . Calcium 30/86/5784 9.3  8.9 - 10.3 mg/dL Final  . Total Protein 12/07/2019 7.7  6.5 - 8.1 g/dL Final  . Albumin 69/62/9528 3.5  3.5 - 5.0 g/dL Final  . AST 41/32/4401 33  15 - 41 U/L Final  . ALT 12/07/2019 71* 0 - 44 U/L Final  . Alkaline Phosphatase 12/07/2019 143* 38 - 126 U/L Final  . Total Bilirubin 12/07/2019 0.5  0.3 - 1.2 mg/dL Final  . GFR calc non Af Amer 12/07/2019 >60  >60 mL/min Final  . GFR calc Af Amer 12/07/2019 >60  >60 mL/min Final  . Anion gap 12/07/2019 13  5 - 15 Final   Performed at [facility], [address], [place], [place] [address]  . TSH 12/07/2019 13.292* 0.350 - 4.500 uIU/mL Final   Comment: Performed by a 3rd Generation assay with a functional sensitivity of <=0.01 uIU/mL. Performed at [facility], [address] [place], [place] [address]   . Valproic Acid Lvl 12/07/2019 21* 50.0 - 100.0 ug/mL Final   Performed at [facility]  [facility], [address], [place], [place] [address]  . POC Amphetamine UR 12/06/2019 None Detected  None Detected Final  . POC Secobarbital (BAR) 12/06/2019 None Detected  None Detected Final  . POC Buprenorphine (BUP) 12/06/2019 None Detected  None Detected Final  . POC Oxazepam (BZO) 12/06/2019 None Detected  None Detected Final  . POC Cocaine UR 12/06/2019 None Detected  None Detected Final  . POC Methamphetamine UR 12/06/2019 None Detected  None Detected Final  . POC Morphine 12/06/2019 None Detected  None Detected Final  .  POC Oxycodone UR 12/06/2019 None Detected  None Detected Final  . POC Methadone UR 12/06/2019 None Detected  None Detected Final  . POC Marijuana UR 12/06/2019 None Detected  None Detected Final  . Hgb A1c MFr Bld 12/07/2019 5.6  4.8 - 5.6 % Final   Comment: (NOTE) Pre diabetes:          5.7%-6.4%  Diabetes:              >6.4%  Glycemic control for   <7.0% adults with diabetes   . Mean Plasma Glucose 12/07/2019 114.02  mg/dL Final   Performed at [facility], [address], [place], [place] [address]  . Lithium Lvl 12/07/2019 0.37* 0.60 - 1.20 mmol/L Final   Performed at [facility], [address] [place], [place] [address]  . Glucose-Capillary 12/06/2019 110* 70 - 99 mg/dL Final   Glucose reference range applies only to samples taken after fasting for at least 8 hours.  . Cholesterol 12/07/2019 213* 0 - 200 mg/dL Final  . Triglycerides 12/07/2019 91  <150 mg/dL Final  . HDL 91/47/8295 35* >40 mg/dL Final  . Total CHOL/HDL Ratio 12/07/2019 6.1  RATIO Final  . VLDL 12/07/2019 18  0 - 40 mg/dL Final  . LDL Cholesterol 12/07/2019 160* 0 - 99 mg/dL Final   Comment:        Total Cholesterol/HDL:CHD Risk Coronary Heart Disease Risk Table                     Men   Women  1/2 Average Risk   3.4   3.3  Average Risk       5.0   4.4  2 X Average Risk   9.6   7.1  3 X Average Risk  23.4   11.0        Use the calculated Patient Ratio above and the CHD Risk Table to determine the patient's CHD Risk.        ATP III CLASSIFICATION (LDL):  <100     mg/dL   Optimal  621-308  mg/dL   Near or Above                    Optimal  130-159  mg/dL   Borderline  657-846  mg/dL   High  >962     mg/dL   Very High Performed at [facility], [address] [place], [place] [address]   . SARS Coronavirus 2 Ag 12/07/2019 NEGATIVE  NEGATIVE Final   Comment: (NOTE) SARS-CoV-2 antigen NOT DETECTED.   Negative results are presumptive.  Negative results do not preclude SARS-CoV-2  infection and should not be used as the sole basis for treatment or other patient management decisions, including infection  control decisions, particularly in the presence of clinical signs and  symptoms consistent with COVID-19, or in those who have been in contact with the virus.  Negative results must be combined with clinical observations, patient history, and epidemiological information. The expected result is Negative.  Fact Sheet for Patients: https://sanders-williams.net/  Fact Sheet for Healthcare Providers: https://martinez.com/   This test is not yet approved or cleared by the [place] FDA and  has been authorized for detection and/or diagnosis of SARS-CoV-2 by FDA under an Emergency Use Authorization (EUA).  This EUA will remain in effect (meaning this test can be used) for the duration of  the C                          OVID-19 declaration under Section 564(b)(1) of the Act, 21 U.S.C. section 360bbb-3(b)(1), unless the authorization is terminated or revoked sooner.    Admission on 10/06/2019, Discharged on 10/07/2019  Component Date Value Ref Range Status  . WBC 10/06/2019 10.3  4.0 - 10.5 K/uL Final  . RBC 10/06/2019 5.30  4.22 - 5.81 MIL/uL Final  . Hemoglobin 10/06/2019 13.1  13.0 - 17.0 g/dL Final  . HCT 16/01/9603 42.8  39 - 52 % Final  . MCV 10/06/2019 80.8  80.0 - 100.0 fL Final  . MCH 10/06/2019 24.7* 26.0 - 34.0 pg Final  . MCHC 10/06/2019 30.6  30.0 - 36.0 g/dL Final  . RDW 54/12/8117 15.9* 11.5 - 15.5 % Final  . Platelets 10/06/2019 245  150 - 400 K/uL Final  . nRBC 10/06/2019 0.0  0.0 - 0.2 % Final  . Neutrophils Relative % 10/06/2019 61  % Final  . Neutro Abs 10/06/2019 6.2  1.7 - 7.7 K/uL Final  . Lymphocytes Relative 10/06/2019 31  % Final  . Lymphs Abs 10/06/2019 3.2  0.7 - 4.0 K/uL Final  . Monocytes Relative 10/06/2019 7  % Final  . Monocytes Absolute 10/06/2019 0.7  0 - 1 K/uL Final  . Eosinophils Relative  10/06/2019 1  % Final  . Eosinophils Absolute 10/06/2019 0.1  0 - 0 K/uL Final  . Basophils Relative 10/06/2019 0  % Final  . Basophils Absolute 10/06/2019 0.0  0 - 0 K/uL Final  . Immature Granulocytes 10/06/2019 0  % Final  . Abs Immature Granulocytes 10/06/2019 0.04  0.00 - 0.07 K/uL Final   Performed at [facility], [address] [place], [place] [address]  . Sodium 10/06/2019 138  135 - 145 mmol/L Final  . Potassium 10/06/2019 3.3* 3.5 - 5.1 mmol/L Final  . Chloride 10/06/2019 104  98 - 111 mmol/L Final  . CO2 10/06/2019 19* 22 - 32 mmol/L Final  . Glucose, Bld 10/06/2019 114* 70 - 99 mg/dL Final   Glucose reference range applies only to samples taken after fasting for at least 8 hours.  . BUN 10/06/2019 12  6 - 20 mg/dL Final  . Creatinine, Ser 10/06/2019 1.09  0.61 - 1.24 mg/dL Final  . Calcium 95/62/1308 9.0  8.9 - 10.3 mg/dL Final  . Total Protein 10/06/2019 7.9  6.5 - 8.1 g/dL Final  . Albumin 65/78/4696 3.8  3.5 - 5.0 g/dL Final  . AST 29/52/8413 49* 15 - 41 U/L Final  . ALT 10/06/2019 69* 0 - 44 U/L Final  . Alkaline Phosphatase 10/06/2019 121  38 - 126 U/L Final  . Total Bilirubin 10/06/2019 0.5  0.3 - 1.2 mg/dL Final  . GFR calc non Af Amer 10/06/2019 >60  >60 mL/min Final  . GFR calc Af Amer 10/06/2019 >60  >60 mL/min Final  . Anion gap 10/06/2019 15  5 - 15 Final   Performed at [facility], [address], [place], [place] [address]  . Lithium Lvl 10/06/2019 0.49* 0.60 - 1.20 mmol/L Final   Performed at [facility], [address], [place], [place] [address]  . Valproic Acid Lvl 10/06/2019 38* 50.0 - 100.0 ug/mL Final   Performed at [facility], [address], [place], [place] [address]  . SARS Coronavirus 2 10/06/2019 NEGATIVE  NEGATIVE Final   Comment: (NOTE) SARS-CoV-2 target nucleic acids are NOT DETECTED. The SARS-CoV-2 RNA is generally detectable in upper and lower respiratory  specimens during the acute phase of infection. The lowest concentration of SARS-CoV-2 viral copies this assay can detect is 250 copies / mL. A negative result does not preclude SARS-CoV-2 infection and should not be used as the sole basis for treatment or other patient management decisions.  A negative result may occur with improper specimen collection / handling, submission of specimen other than nasopharyngeal swab, presence of viral mutation(s) within the areas targeted by this assay, and inadequate number of viral copies (<250 copies / mL). A negative result must be combined with clinical observations, patient history, and epidemiological information. Fact Sheet for Patients:   BoilerBrush.com.cy Fact Sheet for Healthcare Providers: https://pope.com/ This test is not yet approved or cleared                           by the [place] FDA and has been authorized for detection and/or diagnosis of SARS-CoV-2 by FDA under an Emergency Use Authorization (EUA).  This EUA will remain in effect (meaning this test can be used) for the duration of the COVID-19 declaration under Section 564(b)(1) of the Act, 21 U.S.C. section 360bbb-3(b)(1), unless the authorization is terminated or revoked sooner. Performed at [facility], [address] [place], [place] [address]   Admission on 09/27/2019, Discharged on 09/27/2019  Component Date Value Ref Range Status  . WBC 09/27/2019 9.3  4.0 - 10.5 K/uL Final  . RBC 09/27/2019 5.38  4.22 - 5.81 MIL/uL Final  . Hemoglobin 09/27/2019 13.4  13.0 - 17.0 g/dL Final  . HCT 69/67/8938 43.0  39 - 52 % Final  . MCV 09/27/2019 79.9* 80.0 - 100.0 fL Final  . MCH 09/27/2019 24.9* 26.0 - 34.0 pg Final  . MCHC 09/27/2019 31.2  30.0 - 36.0 g/dL Final  . RDW 02/15/5101 15.8* 11.5 - 15.5 % Final  . Platelets 09/27/2019 281  150 - 400 K/uL Final  . nRBC 09/27/2019 0.0  0.0 - 0.2 % Final  .  Neutrophils Relative % 09/27/2019 57  % Final  . Neutro Abs 09/27/2019 5.2  1.7 - 7.7 K/uL Final  . Lymphocytes Relative 09/27/2019 33  % Final  . Lymphs Abs 09/27/2019 3.0  0.7 - 4.0 K/uL Final  . Monocytes Relative 09/27/2019 8  % Final  . Monocytes Absolute 09/27/2019 0.7  0 - 1 K/uL Final  . Eosinophils Relative 09/27/2019 2  % Final  . Eosinophils Absolute 09/27/2019 0.2  0 - 0 K/uL Final  . Basophils Relative 09/27/2019 0  % Final  . Basophils Absolute 09/27/2019 0.0  0 - 0 K/uL Final  . Immature Granulocytes 09/27/2019 0  % Final  . Abs Immature Granulocytes 09/27/2019 0.04  0.00 - 0.07 K/uL Final   Performed at [facility], [address], [place], [place] [address]  . Valproic Acid Lvl 09/27/2019 19*  50.0 - 100.0 ug/mL Final   Performed at [facility], [address] [place], [place] [address]  Admission on 09/21/2019, Discharged on 09/23/2019  Component Date Value Ref Range Status  . SARS Coronavirus 2 09/21/2019 NEGATIVE  NEGATIVE Final   Comment: (NOTE) SARS-CoV-2 target nucleic acids are NOT DETECTED. The SARS-CoV-2 RNA is generally detectable in upper and lower respiratory specimens during the acute phase of infection. The lowest concentration of SARS-CoV-2 viral copies this assay can detect is 250 copies / mL. A negative result does not preclude SARS-CoV-2 infection and should not be used as the sole basis for treatment or other patient management decisions.  A negative result may occur with improper specimen collection / handling, submission of specimen other than nasopharyngeal swab, presence of viral mutation(s) within the areas targeted by this assay, and inadequate number of viral copies (<250 copies / mL). A negative result must be combined with clinical observations, patient history, and epidemiological information. Fact Sheet for Patients:   BoilerBrush.com.cy Fact Sheet for Healthcare  Providers: https://pope.com/ This test is not yet approved or cleared                           by the [place] FDA and has been authorized for detection and/or diagnosis of SARS-CoV-2 by FDA under an Emergency Use Authorization (EUA).  This EUA will remain in effect (meaning this test can be used) for the duration of the COVID-19 declaration under Section 564(b)(1) of the Act, 21 U.S.C. section 360bbb-3(b)(1), unless the authorization is terminated or revoked sooner. Performed at [facility], [address] [place], [place] [address]   . Sodium 09/21/2019 141  135 - 145 mmol/L Final  . Potassium 09/21/2019 4.2  3.5 - 5.1 mmol/L Final  . Chloride 09/21/2019 106  98 - 111 mmol/L Final  . CO2 09/21/2019 28  22 - 32 mmol/L Final  . Glucose, Bld 09/21/2019 127* 70 - 99 mg/dL Final   Glucose reference range applies only to samples taken after fasting for at least 8 hours.  . BUN 09/21/2019 12  6 - 20 mg/dL Final  . Creatinine, Ser 09/21/2019 1.05  0.61 - 1.24 mg/dL Final  . Calcium 62/83/1517 9.2  8.9 - 10.3 mg/dL Final  . Total Protein 09/21/2019 7.9  6.5 - 8.1 g/dL Final  . Albumin 61/60/7371 3.8  3.5 - 5.0 g/dL Final  . AST 10/26/9483 40  15 - 41 U/L Final  . ALT 09/21/2019 90* 0 - 44 U/L Final  . Alkaline Phosphatase 09/21/2019 146* 38 - 126 U/L Final  . Total Bilirubin 09/21/2019 0.5  0.3 - 1.2 mg/dL Final  . GFR calc non Af Amer 09/21/2019 >60  >60 mL/min Final  . GFR calc Af Amer 09/21/2019 >60  >60 mL/min Final  . Anion gap 09/21/2019 7  5 - 15 Final   Performed at [facility], [address] [place], [place] [address]  . Alcohol, Ethyl (B) 09/21/2019 <10  <10 mg/dL Final   Comment: (NOTE) Lowest detectable limit for serum alcohol is 10 mg/dL. For medical purposes only. Performed at [facility], [address], [place], [place] [address]   . Opiates 09/21/2019 NONE DETECTED  NONE  DETECTED Final  . Cocaine 09/21/2019 NONE DETECTED  NONE DETECTED Final  . Benzodiazepines 09/21/2019 NONE DETECTED  NONE DETECTED Final  . Amphetamines 09/21/2019 NONE DETECTED  NONE DETECTED Final  . Tetrahydrocannabinol  09/21/2019 NONE DETECTED  NONE DETECTED Final  . Barbiturates 09/21/2019 NONE DETECTED  NONE DETECTED Final   Comment: (NOTE) DRUG SCREEN FOR MEDICAL PURPOSES ONLY.  IF CONFIRMATION IS NEEDED FOR ANY PURPOSE, NOTIFY LAB WITHIN 5 DAYS. LOWEST DETECTABLE LIMITS FOR URINE DRUG SCREEN Drug Class                     Cutoff (ng/mL) Amphetamine and metabolites    1000 Barbiturate and metabolites    200 Benzodiazepine                 200 Tricyclics and metabolites     300 Opiates and metabolites        300 Cocaine and metabolites        300 THC                            50 Performed at [facility], [address], [place], [place] [address]   . WBC 09/21/2019 9.6  4.0 - 10.5 K/uL Final  . RBC 09/21/2019 5.58  4.22 - 5.81 MIL/uL Final  . Hemoglobin 09/21/2019 13.8  13.0 - 17.0 g/dL Final  . HCT 60/45/4098 44.8  39 - 52 % Final  . MCV 09/21/2019 80.3  80.0 - 100.0 fL Final  . MCH 09/21/2019 24.7* 26.0 - 34.0 pg Final  . MCHC 09/21/2019 30.8  30.0 - 36.0 g/dL Final  . RDW 11/91/4782 15.8* 11.5 - 15.5 % Final  . Platelets 09/21/2019 255  150 - 400 K/uL Final  . nRBC 09/21/2019 0.0  0.0 - 0.2 % Final  . Neutrophils Relative % 09/21/2019 72  % Final  . Neutro Abs 09/21/2019 6.9  1.7 - 7.7 K/uL Final  . Lymphocytes Relative 09/21/2019 19  % Final  . Lymphs Abs 09/21/2019 1.8  0.7 - 4.0 K/uL Final  . Monocytes Relative 09/21/2019 6  % Final  . Monocytes Absolute 09/21/2019 0.6  0 - 1 K/uL Final  . Eosinophils Relative 09/21/2019 2  % Final  . Eosinophils Absolute 09/21/2019 0.2  0 - 0 K/uL Final  . Basophils Relative 09/21/2019 0  % Final  . Basophils Absolute 09/21/2019 0.0  0 - 0 K/uL Final  . Immature Granulocytes 09/21/2019 1  % Final  . Abs  Immature Granulocytes 09/21/2019 0.05  0.00 - 0.07 K/uL Final   Performed at [facility], [address], [place], [place] [address]  . Valproic Acid Lvl 09/21/2019 24* 50.0 - 100.0 ug/mL Final   Performed at [facility], [address] [place], [place] [address]  . Lithium Lvl 09/21/2019 0.29* 0.60 - 1.20 mmol/L Final   Performed at [facility], [address] [place], [place] [address]    Allergies: Other and Shellfish allergy  PTA Medications: (Not in a [facility] admission)   Medical Decision Making  CBC-WBC slightly elevated at 10.8 ALT 71, previous ALT 69 and 90 TSH 13.292, will check free T4 Hemoglobin A1c 5.6 Lithium level below therapeutic range at 0.37 Valproic acid level below therapeutic range at 21 Urine drug screen negative  Continue Home Medications  Clozapine 100 mg p.o. daily for schizophrenia Clozapine 300 mg p.o. nightly for schizophrenia Lithium carbonate CR 900 mg p.o. nightly QHS for mood stability Valproic acid 500 mg p.o. daily  Zydis 10 mg x 1 dose for hallucinations Ativan 2 mg x 1 dose for anxiety  Agitation protocol  Recommendations  Based on my evaluation the patient does not appear to have an emergency medical condition.   Patient will be placed in the continuous assessment area at [facility] for treatment and stabilization. He will be reevaluated on 12/07/2019.The treatment team will determine disposition at that time.      Jackelyn Poling, NP 12/07/19  5:13 AM

## 2019-12-07 NOTE — Care Management (Signed)
Writer provided the patient mother with referral information for ACTT services.

## 2019-12-07 NOTE — ED Provider Notes (Signed)
FBC/OBS ASAP Discharge Summary  Date and Time: 12/07/2019 12:26 PM  Name: Dylan [facility]  MRN:  [id number]   Discharge Diagnoses:  Final diagnoses:  Schizophrenia, unspecified type [facility])   Stay Summary: From admission H&P: Dylan [facility] is a 29 y.o. male who presents voluntarily to [facility] due to suicidal thoughts and voices telling him to kill himself.  Shortly after arrival, patient walking to TTS office and stated to clinician "I am going to sue, I am going to sue, I feel unsafe, I am going to hurt myself, I am going to hurt somebody right now, I am going to tear this place up, I feel like tearing something up." Patient made similar comments during a stay in the observation unit at [facility] in June 2021 and shortly after making the comments at that time he significantly damaged property at [facility]. He was not aggressive towards staff during that admission. Patient endorses suicidal thoughts, denies any intent or plan. He denies homicidal ideations. He states that voices are telling him to kill himself.  [name] was admitted for suicidal thoughts and auditory hallucinations. He was monitored overnight in the [facility]. He received one dose of PRN Zyprexa and Ativan. Home medications were resumed. Patient reports compliance with lithium 900 mg QHS, Depakote 500 mg QAM, and Clozaril 100 mg QAM, 300 mg QHS. Lithium level 0.37, and VPA level 21. Patient was advised of subtherapeutic levels of lithium and VPA but states he is compliant with medications and took them yesterday. Depakote was increased to 500 mg BID. Patient slept well through the night. On assessment this morning, he presents with flat affect and appears groggy but reports good mood, improved from yesterday. He reports stable mood over the last several weeks but states he became suicidal yesterday due to hearing voices. He denies any AVH today and shows no signs of responding to internal stimuli. He presents with minimal but well-organized speech. He denies  any SI/HI. He has shown no agitated or aggressive behaviors while in observation.  With patient's expressed consent, collateral information was obtained from his mother, [name] [phone]). Ms. Kizzie Bane reports the patient was hospitalized last month at [facility] and has been doing well since discharge. She states patient had been talking more and seeming more engaged with the family. Yesterday was the first day that he did not seem to be feeling well, and the patient asked to be brought here for assessment. Ms. Kizzie Bane states she believes patient has been taking medications as prescribed. She was informed of low VPA and lithium levels indicating possible noncompliance but states she watches the patient take medications. She denies any safety concerns for discharge and states, "He's really good at letting us know if he's not feeling well." I reinforced with patient and his mother to return for assessment if hallucinations/SI return, and they both express understanding. Per chart review, Clozaril was recently increased. I am sending Zyprexa prescription PRN for breakthrough hallucinations, as well as increased dose of Depakote.  Total Time spent with patient: 45 minutes  Past Psychiatric History:  Past Medical History:  Past Medical History:  Diagnosis Date  . Asthma   . Schizophrenia (HCC)   . Seizures (HCC)    History reviewed. No pertinent surgical history. Family History:  Family History  Problem Relation Age of Onset  . Diabetes Mother    Family Psychiatric History: Schizophrenia with multiple hospitalizations, most recently at [facility] in June 2021. Mother reports patient received ACT services in [place] until moving  to [place] four years ago. Social History:  Social History   Substance and Sexual Activity  Alcohol Use Never     Social History   Substance and Sexual Activity  Drug Use Never    Social History   Socioeconomic History  . Marital status: Single    Spouse  name: Not on file  . Number of children: Not on file  . Years of education: Not on file  . Highest education level: Not on file  Occupational History  . Not on file  Tobacco Use  . Smoking status: Never Smoker  . Smokeless tobacco: Never Used  Vaping Use  . Vaping Use: Never used  Substance and Sexual Activity  . Alcohol use: Never  . Drug use: Never  . Sexual activity: Never  Other Topics Concern  . Not on file  Social History Narrative  . Not on file   Social Determinants of Health   Financial Resource Strain:   . Difficulty of Paying Living Expenses:   Food Insecurity:   . Worried About Programme researcher, broadcasting/film/video in the Last Year:   . Barista in the Last Year:   Transportation Needs:   . Freight forwarder (Medical):   Marland Kitchen Lack of Transportation (Non-Medical):   Physical Activity:   . Days of Exercise per Week:   . Minutes of Exercise per Session:   Stress:   . Feeling of Stress :   Social Connections:   . Frequency of Communication with Friends and Family:   . Frequency of Social Gatherings with Friends and Family:   . Attends Religious Services:   . Active Member of Clubs or Organizations:   . Attends Banker Meetings:   Marland Kitchen Marital Status:    SDOH:  SDOH Screenings   Alcohol Screen:   . Last Alcohol Screening Score (AUDIT):   Depression (PHQ2-9): Low Risk   . PHQ-2 Score: 0  Financial Resource Strain:   . Difficulty of Paying Living Expenses:   Food Insecurity:   . Worried About Programme researcher, broadcasting/film/video in the Last Year:   . [employer] of Food in the Last Year:   Housing:   . Last Housing Risk Score:   Physical Activity:   . Days of Exercise per Week:   . Minutes of Exercise per Session:   Social Connections:   . Frequency of Communication with Friends and Family:   . Frequency of Social Gatherings with Friends and Family:   . Attends Religious Services:   . Active Member of Clubs or Organizations:   . Attends Banker Meetings:    Marland Kitchen Marital Status:   Stress:   . Feeling of Stress :   Tobacco Use: Low Risk   . Smoking Tobacco Use: Never Smoker  . Smokeless Tobacco Use: Never Used  Transportation Needs:   . Freight forwarder (Medical):   Marland Kitchen Lack of Transportation (Non-Medical):     Has this patient used any form of tobacco in the last 30 days? (Cigarettes, Smokeless Tobacco, Cigars, and/or Pipes) No Current Medications:  Current Facility-Administered Medications  Medication Dose Route Frequency Provider Last Rate Last Admin  . acetaminophen (TYLENOL) tablet 650 mg  650 mg Oral Q6H PRN Nira Conn A, NP      . albuterol (VENTOLIN HFA) 108 (90 Base) MCG/ACT inhaler 1-2 puff  1-2 puff Inhalation Q6H PRN Jackelyn Poling, NP      . alum & mag hydroxide-simeth (MAALOX/MYLANTA) 200-200-20 MG/5ML  suspension 30 mL  30 mL Oral Q4H PRN Nira ConnBerry, Jason A, NP      . cloZAPine (CLOZARIL) tablet 100 mg  100 mg Oral Daily Nira ConnBerry, Jason A, NP   100 mg at 12/07/19 0940  . [START ON 12/08/2019] cloZAPine (CLOZARIL) tablet 300 mg  300 mg Oral QHS Nira ConnBerry, Jason A, NP      . Melene Muller[START ON 12/08/2019] lithium carbonate (ESKALITH) CR tablet 900 mg  900 mg Oral QHS Nira ConnBerry, Jason A, NP      . OLANZapine zydis (ZYPREXA) disintegrating tablet 10 mg  10 mg Oral Q8H PRN Nira ConnBerry, Jason A, NP   10 mg at 12/07/19 0941   And  . LORazepam (ATIVAN) tablet 1 mg  1 mg Oral PRN Nira ConnBerry, Jason A, NP      . magnesium hydroxide (MILK OF MAGNESIA) suspension 30 mL  30 mL Oral Daily PRN Nira ConnBerry, Jason A, NP      . valproic acid (DEPAKENE) 250 MG/5ML solution 500 mg  500 mg Oral BID Aldean BakerSykes, Sheree Lalla E, NP       Current Outpatient Medications  Medication Sig Dispense Refill  . cloZAPine (CLOZARIL) 100 MG tablet Take 1.5 tablets (150 mg total) by mouth 2 (two) times daily. 30 tablet 0  . lithium carbonate (ESKALITH) 450 MG CR tablet Take 900 mg by mouth at bedtime.    Marland Kitchen. albuterol (VENTOLIN HFA) 108 (90 Base) MCG/ACT inhaler Inhale 1-2 puffs into the lungs every 6 (six) hours  as needed for wheezing or shortness of breath. 1 Inhaler 11  . diclofenac Sodium (VOLTAREN) 1 % GEL Apply 2 g topically 4 (four) times daily. 100 g 1  . loperamide (IMODIUM) 2 MG capsule TAKE 1 CAPSULE BY MOUTH AS NEEDED FOR FOR DIARRHEA OR LOOSE STOOLS. (Patient not taking: Reported on 11/21/2019) 30 capsule 0  . naproxen (NAPROSYN) 500 MG tablet Take 1 tablet (500 mg total) by mouth 2 (two) times daily with a meal. 30 tablet 0  . valproic acid (DEPAKENE) 250 MG capsule Take 2 capsules (500 mg total) by mouth 2 (two) times daily. 120 capsule 0  . VIIBRYD 40 MG TABS Take 40 mg by mouth daily. (Patient not taking: Reported on 11/21/2019)      PTA Medications: (Not in a hospital admission)   Musculoskeletal  Strength & Muscle Tone: within normal limits Gait & Station: normal Patient leans: N/A  Psychiatric Specialty Exam  Presentation  General Appearance: Fairly Groomed  Eye Contact:Minimal  Speech:Slow  Speech Volume:Decreased  Handedness:No data recorded  Mood and Affect  Mood:Euthymic  Affect:Flat   Thought Process  Thought Processes:Coherent  Descriptions of Associations:Intact  Orientation:Full (Time, Place and Person)  Thought Content:Other (comment) (Poverty of content)  Hallucinations:Hallucinations: None Description of Auditory Hallucinations: reports hearing voices that tell him to kill himself  Ideas of Reference:None  Suicidal Thoughts:Suicidal Thoughts: No SI Active Intent and/or Plan: Without Intent;Without Plan  Homicidal Thoughts:Homicidal Thoughts: No   Sensorium  Memory:Immediate Fair;Recent Fair;Remote Fair  Judgment:Poor  Insight:Poor   Executive Functions  Concentration:Fair  Attention Span:Fair  Recall:Fair  Fund of Knowledge:Fair  Language:Fair   Psychomotor Activity  Psychomotor Activity:Psychomotor Activity: Decreased   Assets  Assets:Communication Skills;Housing;Social Support   Sleep  Sleep:Sleep:  Fair   Physical Exam  Physical Exam ROS Blood pressure 126/79, pulse 89, temperature (!) 97.5 F (36.4 C), temperature source Oral, resp. rate 18, SpO2 97 %. There is no height or weight on file to calculate BMI.  Demographic Factors:  Male and  Adolescent or young adult  Loss Factors: NA  Historical Factors: Impulsivity  Risk Reduction Factors:   Sense of responsibility to family, Living with another person, especially a relative, Positive social support and Positive therapeutic relationship  Continued Clinical Symptoms:  Previous Psychiatric Diagnoses and Treatments  Cognitive Features That Contribute To Risk:  None    Suicide Risk:  Mild:  Suicidal ideation of limited frequency, intensity, duration, and specificity.  There are no identifiable plans, no associated intent, mild dysphoria and related symptoms, good self-control (both objective and subjective assessment), few other risk factors, and identifiable protective factors, including available and accessible social support.  Plan Of Care/Follow-up recommendations: Activity as tolerated. Diet as recommended by primary care physician. Keep all scheduled follow-up appointments as recommended.  Disposition: Stable for discharge home. Continue to follow up with [facility] outpatient. CSW provided patient's mother with list of local ACT team services per her request.  Aldean Baker, NP 12/07/2019, 12:26 PM

## 2019-12-07 NOTE — Discharge Instructions (Addendum)
Activity as tolerated. Diet as recommended by primary care physician. Keep all scheduled follow-up appointments as recommended.  

## 2019-12-07 NOTE — ED Notes (Signed)
Per collateral contact,  consent given by patient to speak with [name], [phone]. Lynden Ang reported patient disclosed to her that he was hearing voices and that he was suicidal.

## 2019-12-07 NOTE — ED Notes (Signed)
Pt belongings placed in brown paper bag and in locker 29. (green/blue checkered pants)

## 2019-12-07 NOTE — ED Notes (Signed)
Pt discharged in no acute distress. Denies SI/HI or A/VH upon discharge. Verbalized understanding of all discharge instructions reviewed by staff. All pt belongings returned to pt intact from locker #29. Pt escorted to lobby via staff where pt's mother was awaiting to transport pt to home. Safety maintained.

## 2019-12-07 NOTE — ED Notes (Signed)
Pt A&O, calm and cooperative. Due to known history of aggression at previous [facility] encounters, pt given meds before assessment completed. Pt reports hearing voices to this RN as reason for coming in to [facility]. On further questioning does admit to passive SI earlier in day but none presently. Pt minimally responding to questions, not engaging in conversation with staff but compliant with all requests. Pt admitted to beginning to feel drowsy toward end of assessment. Stable on feet, NAD. Assessment completed without incident and pt shown to pull out chair.

## 2019-12-23 ENCOUNTER — Ambulatory Visit: Payer: Medicare HMO | Admitting: Physical Therapy

## 2020-01-19 ENCOUNTER — Ambulatory Visit (HOSPITAL_COMMUNITY)
Admission: EM | Admit: 2020-01-19 | Discharge: 2020-01-19 | Disposition: A | Discharge status: Home / Self Care | Payer: Medicare HMO | Attending: Psychiatry | Admitting: Psychiatry

## 2020-01-19 ENCOUNTER — Other Ambulatory Visit: Payer: Self-pay

## 2020-01-19 DIAGNOSIS — F259 Schizoaffective disorder, unspecified: Secondary | ICD-10-CM | POA: Diagnosis not present

## 2020-01-19 DIAGNOSIS — Z79899 Other long term (current) drug therapy: Secondary | ICD-10-CM | POA: Insufficient documentation

## 2020-01-19 DIAGNOSIS — R440 Auditory hallucinations: Secondary | ICD-10-CM | POA: Diagnosis present

## 2020-01-19 NOTE — ED Provider Notes (Signed)
Behavioral Health Urgent Care Medical Screening Exam  Patient Name: Dylan [facility] MRN: [id number] Date of Evaluation: 01/19/20 Chief Complaint:   Diagnosis:  Final diagnoses:  Schizoaffective disorder, unspecified type (HCC)    History of Present illness: Dylan Flynn is a 29 y.o. male. Presented to [facility] behavioral Health accompanied with his mother [name]).  Dylan Flynn provided verbal authorization for mother to participate with assessment. Dylan Flynn reports " I had a relapse."  States he has been experiencing auditory hallucinations that is unrelieved with medications.  Patient denies voices are command in nature.  Reported patient started banging closed fist on the table, when the voices get too loud.  He reports a history of schizophrenia. Patient denied suicidal or homicidal ideation.  Denies visual hallucinations.  Currently denying auditory hallucinations states experiencing worsening hallucinations this morning.  Does report taking medication as directed.  Patient reports he is prescribed Clozaril, Abilify, Lamictal and Depakote.  States he is followed by [facility] for therapy and psychiatric services.  Patient denies substance abuse or illicit drug use.  [name] patient's mother denied any safety concerns during this assessment.  States patient became irritable and agitated due to voices.  States he voiced concerns of being evaluated today.  Reports he needs additional therapy.  Discussed patient to start partial hospitalization programming family was receptive to plan. Support, encouragement and reassurance was provided  Psychiatric Specialty Exam  Presentation  General Appearance:Appropriate for Environment  Eye Contact:Good  Speech:Clear and Coherent  Speech Volume:Normal  Handedness:Right   Mood and Affect  Mood:Anxious  Affect:Congruent   Thought Process  Thought Processes:Coherent  Descriptions of Associations:Intact  Orientation:Full (Time, Place and  Person)  Thought Content:Logical  Hallucinations:Auditory voices telling me to hit the table  Ideas of Reference:None  Suicidal Thoughts:No Without Intent;Without Plan  Homicidal Thoughts:No   Sensorium  Memory:Immediate Fair;Recent Fair;Remote Fair  Judgment:Intact  Insight:Poor   Executive Functions  Concentration:Fair  Attention Span:Poor  Recall:Poor  Fund of Knowledge:Fair  Language:Fair   Psychomotor Activity  Psychomotor Activity:Normal   Assets  Assets:Social Support;Communication Skills   Sleep  Sleep:Fair  Number of hours: 5   Physical Exam: Physical Exam Vitals reviewed.  Neurological:     Mental Status: He is alert.  Psychiatric:        Mood and Affect: Mood normal.    Review of Systems  Psychiatric/Behavioral: Positive for hallucinations. Negative for depression and suicidal ideas.  All other systems reviewed and are negative.  There were no vitals taken for this visit. There is no height or weight on file to calculate BMI.  Musculoskeletal: Strength & Muscle Tone: within normal limits Gait & Station: normal Patient leans: N/A   [facility] MSE Discharge Disposition for Follow up and Recommendations: Based on my evaluation the patient does not appear to have an emergency medical condition and can be discharged with resources and follow up care in outpatient services for Partial Hospitalization Program and Individual Therapy   Oneta Rack, NP 01/19/2020, 5:32 PM

## 2020-01-19 NOTE — BH Assessment (Addendum)
Comprehensive Clinical Assessment (CCA) Screening, Triage and Referral Note  01/19/2020 Dylan Flynn [id number]   Patient is a 29 y.o. male with a history of Schizophrenia who presents voluntarily, accompanied by his mother, to [facility] for assessment. Per chart review patient has a recent history of agitation and threatening behavior including property destruction upon admission to [facility] and [facility].  Security with [facility] aware and standing by during assessment.  Patient reports worsening auditory hallucinations, stating he is having a "relapse with Schizophrenia."  He is followed by [facility] for medication management and he is compliant with medications.  Patient is currently denying SI, HI and AVH.  He is not currently experiencing auditory hallucinations, he denies symptoms of paranoia and he does not appear to be responding to internal stimuli.  Patient gave consent for mother to be present during assessment.  Given patient's history, she was concerned that patient was experiencing worsening psychosis with some early signs of agitation. She states patient has PRN zyprexa for unmanageable hallucinations.  He began to have difficulty managing symptoms and patient's mother observed him slam his hands on the counter and begin to hit things. When medication did not seem to be improving symptoms, she decided to bring patient in for assessment as a preventative measure, given his history of violent behavior and property destruction.  Treatment options were discussed with Dylan Jacks, NP.  Patient is interested in following up the the new PHP program offered at [facility].   Disposition: Per Dylan Jacks, NP patient does not meet inpatient criteria. He has been encouraged to contact his provider with [facility] to discuss medication adjustment options.   He was also encouraged to consider PHP with [facility] for more intensive treatment.    Visit Diagnosis:    ICD-10-CM   1.  Schizoaffective disorder, unspecified type (HCC)  F25.9     Patient Reported Information How did you hear about [place]? Family/Friend   Referral name: Patient presents with his mother, Dylan Flynn,  for assessment.   Referral phone number: [phone]  Whom do you see for routine medical problems? I don't have a doctor   Practice/Facility Name: No data recorded  Practice/Facility Phone Number: No data recorded  Name of Contact: No data recorded  Contact Number: No data recorded  Contact Fax Number: No data recorded  Prescriber Name: No data recorded  Prescriber Address (if known): No data recorded What Is the Reason for Your Visit/Call Today? Patient presents reporting auditory hallucinations has been unmanageable at points.  He took a PRN medication, which did not improve symptoms.  How Long Has This Been Causing You Problems? 1 wk - 1 month  Have You Recently Been in Any Inpatient Treatment (Hospital/Detox/Crisis Center/28-Day Program)? Yes   Name/Location of Program/Hospital:12/07/19 overnight continuous assessment at [facility] - last inpt admission 10/2019   How Long Were You There? No data recorded  When Were You Discharged? No data recorded Have You Ever Received Flynn From [facility] Before? Yes   Who Do You See at [facility]? ED visits and inpatient admissions  Have You Recently Had Any Thoughts About Hurting Yourself? No   Are You Planning to Commit Suicide/Harm Yourself At This time?  No  Have you Recently Had Thoughts About Hurting Someone Dylan Flynn? No   Explanation: no specific person identified  Have You Used Any Alcohol or Drugs in the Past 24 Hours? No   How Long Ago Did You Use Drugs or Alcohol?  No data recorded  What  Did You Use and How Much? No data recorded What Do You Feel Would Help You the Most Today? Therapy;Medication  Do You Currently Have a Therapist/Psychiatrist? Yes   Name of Therapist/Psychiatrist: [facility]   Have You Been Recently Discharged From Any Office  Practice or Programs? No   Explanation of Discharge From Practice/Program:  No data recorded    CCA Screening Triage Referral Assessment Type of Contact: Face-to-Face   Is this Initial or Reassessment? No data recorded  Date Telepsych consult ordered in [facility]:  10/05/19   Time Telepsych consult ordered in [facility]:  2330  Patient Reported Information Reviewed? Yes   Patient Left Without Being Seen? No data recorded  Reason for Not Completing Assessment: No data recorded Collateral Involvement: Dylan Flynn, mother, [phone]  Does Patient Have a Court Appointed Legal Guardian? No data recorded  Name and Contact of Legal Guardian:  Self  If Minor and Not Living with Parent(s), Who has Custody? N/A  Is CPS involved or ever been involved? Never  Is APS involved or ever been involved? Never  Patient Determined To Be At Risk for Harm To Self or Others Based on Review of Patient Reported Information or Presenting Complaint? No   Method: No data recorded  Availability of Means: No data recorded  Intent: No data recorded  Notification Required: No data recorded  Additional Information for Danger to Others Potential:  No data recorded  Additional Comments for Danger to Others Potential:  No data recorded  Are There Guns or Other Weapons in Your Home?  No data recorded   Types of Guns/Weapons: No data recorded   Are These Weapons Safely Secured?                              No data recorded   Who Could Verify You Are Able To Have These Secured:    No data recorded Do You Have any Outstanding Charges, Pending Court Dates, Parole/Probation? No data recorded Contacted To Inform of Risk of Harm To Self or Others: Family/Significant Other:  Location of Assessment: [facility] Assessment Flynn  Does Patient Present under Involuntary Commitment? No   IVC Papers Initial File Date: No data recorded  [place] of Residence: [place]  Patient Currently Receiving the Following Flynn:  Medication Management;Individual Therapy   Determination of Need: Routine (7 days)   Options For Referral: Medication Management;Intensive Outpatient Therapy   Yetta Glassman

## 2020-01-19 NOTE — Discharge Instructions (Signed)
Take all medications as prescribed. Keep all follow-up appointments as scheduled.  Do not consume alcohol or use illegal drugs while on prescription medications. Report any adverse effects from your medications to your primary care provider promptly.  In the event of recurrent symptoms or worsening symptoms, call 911, a crisis hotline, or go to the nearest emergency department for evaluation.    **You re encouraged to reach out to [facility] to discuss the Partial Hospitalization Program offered virtually.   [phone]

## 2020-01-20 ENCOUNTER — Telehealth (HOSPITAL_COMMUNITY): Payer: Self-pay | Admitting: Professional

## 2020-01-23 ENCOUNTER — Ambulatory Visit (INDEPENDENT_AMBULATORY_CARE_PROVIDER_SITE_OTHER): Payer: Medicare HMO | Admitting: Professional

## 2020-01-23 DIAGNOSIS — F203 Undifferentiated schizophrenia: Secondary | ICD-10-CM | POA: Diagnosis not present

## 2020-01-24 ENCOUNTER — Ambulatory Visit (HOSPITAL_COMMUNITY): Payer: Medicare HMO | Admitting: Professional

## 2020-01-24 DIAGNOSIS — F203 Undifferentiated schizophrenia: Secondary | ICD-10-CM

## 2020-01-24 NOTE — Progress Notes (Signed)
Virtual Visit via Video Note  I connected with Dylan Flynn on 01/24/20 at  9:00 AM EDT by a video enabled telemedicine application and verified that I am speaking with the correct person using two identifiers.   I discussed the limitations of evaluation and management by telemedicine and the availability of in person appointments. The patient expressed understanding and agreed to proceed.   I discussed the assessment and treatment plan with the patient. The patient was provided an opportunity to ask questions and all were answered. The patient agreed with the plan and demonstrated an understanding of the instructions.   The patient was advised to call back or seek an in-person evaluation if the symptoms worsen or if the condition fails to improve as anticipated.  I provided 15 minutes of non-face-to-face time during this encounter.   Oneta Rack, NP    Psychiatric Initial Adult Assessment for [facility]- Partial Hospitalization Program   Patient Identification: Dylan Flynn MRN:  [id number] Date of Evaluation:  01/24/2020 Referral Source: [facility] Chief Complaint:  uncontrolled aggression  Visit Diagnosis: No diagnosis found.  History of Present Illness:  Per Dylan Flynn is a 29 y.o. male. Present[facility] to [facility] accompanied with his mother [name]).  Dylan Flynn provided verbal authorization for mother to participate with assessment. Brentley reports " I had a relapse."  States he has been experiencing auditory hallucinations that is unrelieved with medications.  Patient denies voices are command in nature.  Reported patient started banging closed fist on the table, when the voices get too loud.  He reports a history of schizophrenia. Patient denied suicidal or homicidal ideation.  Denies visual hallucinations.  Currently denying auditory hallucinations states experiencing worsening hallucinations this morning.  Does report taking medication as directed.  Patient reports he is  prescribed Clozaril, Abilify, Lamictal and Depakote.  States he is followed by [facility] for therapy and psychiatric services.  Patient denies substance abuse or illicit drug use.   Evaluation: Dylan Flynn was seen via WebEx.  He is awake, alert and oriented x3.  Reported history with schizophrenia disorder and depression.  He is denying auditory hallucinations which he reports has resolved over the past 3 days since taking medications as directed.  Patient reports he would like to speak to a therapist daily to help with mood stabilization.  Mother was receptive to plan. Patient to start partial hospitalization programming.01/24/2020  Associated Signs/Symptoms: Depression Symptoms:  difficulty concentrating, anxiety, (Hypo) Manic Symptoms:  Impulsivity, Irritable Mood, Anxiety Symptoms:  Excessive Worry, Social Anxiety, Psychotic Symptoms:  Hallucinations: Auditory PTSD Symptoms: NA  Past Psychiatric History:   Previous Psychotropic Medications: Yes   Substance Abuse History in the last 12 months:  No.  Consequences of Substance Abuse: NA  Past Medical History:  Past Medical History:  Diagnosis Date  . Asthma   . Schizophrenia (HCC)   . Seizures (HCC)    No past surgical history on file.  Family Psychiatric History:   Family History:  Family History  Problem Relation Age of Onset  . Diabetes Mother     Social History:   Social History   Socioeconomic History  . Marital status: Single    Spouse name: Not on file  . Number of children: Not on file  . Years of education: Not on file  . Highest education level: Not on file  Occupational History  . Not on file  Tobacco Use  . Smoking status: Never Smoker  . Smokeless tobacco: Never Used  Vaping Use  .  Vaping Use: Never used  Substance and Sexual Activity  . Alcohol use: Never  . Drug use: Never  . Sexual activity: Never  Other Topics Concern  . Not on file  Social History Narrative  . Not on file   Social  Determinants of Health   Financial Resource Strain:   . Difficulty of Paying Living Expenses: Not on file  Food Insecurity:   . Worried About Programme researcher, broadcasting/film/video in the Last Year: Not on file  . Ran Out of Food in the Last Year: Not on file  Transportation Needs:   . Lack of Transportation (Medical): Not on file  . Lack of Transportation (Non-Medical): Not on file  Physical Activity:   . Days of Exercise per Week: Not on file  . Minutes of Exercise per Session: Not on file  Stress:   . Feeling of Stress : Not on file  Social Connections:   . Frequency of Communication with Friends and Family: Not on file  . Frequency of Social Gatherings with Friends and Family: Not on file  . Attends Religious Services: Not on file  . Active Member of Clubs or Organizations: Not on file  . Attends Banker Meetings: Not on file  . Marital Status: Not on file    Additional Social History:   Allergies:   Allergies  Allergen Reactions  . Other     Tree nuts  . Shellfish Allergy     Metabolic Disorder Labs: Lab Results  Component Value Date   HGBA1C 5.6 12/07/2019   MPG 114.02 12/07/2019   No results found for: PROLACTIN Lab Results  Component Value Date   CHOL 213 (H) 12/07/2019   TRIG 91 12/07/2019   HDL 35 (L) 12/07/2019   CHOLHDL 6.1 12/07/2019   VLDL 18 12/07/2019   LDLCALC 160 (H) 12/07/2019   Lab Results  Component Value Date   TSH 13.292 (H) 12/07/2019    Therapeutic Level Labs: Lab Results  Component Value Date   LITHIUM 0.37 (L) 12/07/2019   No results found for: CBMZ Lab Results  Component Value Date   VALPROATE 21 (L) 12/07/2019    Current Medications: Current Outpatient Medications  Medication Sig Dispense Refill  . albuterol (VENTOLIN HFA) 108 (90 Base) MCG/ACT inhaler Inhale 1-2 puffs into the lungs every 6 (six) hours as needed for wheezing or shortness of breath. 1 Inhaler 11  . cloZAPine (CLOZARIL) 100 MG tablet Take 1.5 tablets (150 mg  total) by mouth 2 (two) times daily. 30 tablet 0  . diclofenac Sodium (VOLTAREN) 1 % GEL Apply 2 g topically 4 (four) times daily. 100 g 1  . lithium carbonate (ESKALITH) 450 MG CR tablet Take 900 mg by mouth at bedtime.    Marland Kitchen loperamide (IMODIUM) 2 MG capsule TAKE 1 CAPSULE BY MOUTH AS NEEDED FOR FOR DIARRHEA OR LOOSE STOOLS. (Patient not taking: Reported on 11/21/2019) 30 capsule 0  . OLANZapine (ZYPREXA) 5 MG tablet Take 1 tablet (5 mg total) by mouth daily as needed (hallucinations). 15 tablet 0  . valproic acid (DEPAKENE) 250 MG capsule Take 2 capsules (500 mg total) by mouth 2 (two) times daily. 120 capsule 0  . VIIBRYD 40 MG TABS Take 40 mg by mouth daily. (Patient not taking: Reported on 11/21/2019)     No current facility-administered medications for this visit.    Musculoskeletal: Strength & Muscle Tone: within normal limits Gait & Station: normal Patient leans: N/A  Psychiatric Specialty Exam: Review of Systems  There were no vitals taken for this visit.There is no height or weight on file to calculate BMI.  General Appearance: Casual  Eye Contact:  Good  Speech:  Clear and Coherent  Volume:  Normal  Mood:  Anxious  Affect:  Congruent  Thought Process:  Coherent  Orientation:  Full (Time, Place, and Person)  Thought Content:  Hallucinations: None  Suicidal Thoughts:  No  Homicidal Thoughts:  No  Memory:  Immediate;   Fair Recent;   Fair  Judgement:  Fair  Insight:  Fair  Psychomotor Activity:  Normal  Concentration:  Concentration: Fair  Recall:  [employer] of Knowledge:Fair  Language: Fair  Akathisia:  No  Handed:  Right  AIMS (if indicated):    Assets:  Communication Skills Desire for Improvement Resilience Social Support  ADL's:  Intact  Cognition: WNL  Sleep:  Fair   Screenings: PHQ2-9     Office Visit from 11/18/2019 in [facility] Office Visit from 10/25/2019 in [facility] Office Visit from 08/26/2019 in [facility] Office Visit from 12/12/2018 in [facility] Office Visit from 11/06/2018 in [facility]  PHQ-2 Total Score 0 0 0 0 0  PHQ-9 Total Score 0 -- -- -- --      Assessment and Plan:  Patient to start [facility] Partial hospitalization programming Continue medications as directed   Treatment plan was reviewed and agreed upon by NPT Dylan Flynn and patient Dylan Flynn need for group services    Oneta Rack, NP 9/24/202110:19 AM

## 2020-01-27 ENCOUNTER — Ambulatory Visit (INDEPENDENT_AMBULATORY_CARE_PROVIDER_SITE_OTHER): Payer: Medicare HMO | Admitting: Professional

## 2020-01-27 DIAGNOSIS — F203 Undifferentiated schizophrenia: Secondary | ICD-10-CM | POA: Diagnosis not present

## 2020-01-28 ENCOUNTER — Encounter (HOSPITAL_COMMUNITY): Payer: Self-pay

## 2020-01-28 ENCOUNTER — Ambulatory Visit (INDEPENDENT_AMBULATORY_CARE_PROVIDER_SITE_OTHER): Payer: Medicare HMO | Admitting: Professional

## 2020-01-28 ENCOUNTER — Other Ambulatory Visit: Payer: Self-pay

## 2020-01-28 VITALS — Ht 68.0 in | Wt 300.0 lb

## 2020-01-28 DIAGNOSIS — F203 Undifferentiated schizophrenia: Secondary | ICD-10-CM

## 2020-01-28 NOTE — Progress Notes (Signed)
Met with patient through a virtual connection.  Patient appeared with a flat affect, level mood and denied any suicidal or homicidal ideations and no plans or intent to harm self or others at this time.  Patient stated he came to PHP after recently experiencing an increase in auditory hallucinations.  States he was in the hospital earlier this year and then came [facility] the [facility] for similar symptoms.  Patient stated he is liking the Partial Hospitalization Program and feels he is learning some coping skills that can help him stay well.  Patient reported he lives with his Mother and Grandmother and that they do help him remain compliant with medications.  States he is due to have labwork to obtain his upcoming needed refill of Clozaril in 1-2 weeks and denies missing any current dosages.  Reviewed all medications with patient as record shows last labs from 12/07/19.  Patient may have gotten labs from outside of [place] so will continue to monitor this and patient signs and symptoms.  Patient stable at this time with no complaints and will continue with PHP.  Short term goals to take medications daily to manage symptoms of auditory hallucinations and to maintain independence in living with his Mother and Grandmother without need for further inpatient care.  Patient denies any current medical issues or diagnoses. 

## 2020-01-28 NOTE — Progress Notes (Signed)
Virtual Visit via Video Note  I connected with Dylan Flynn on 01/28/20 at  9:00 AM EDT by a video enabled telemedicine application and verified that I am speaking with the correct person using two identifiers.   I discussed the limitations of evaluation and management by telemedicine and the availability of in person appointments. The patient expressed understanding and agreed to proceed.  I discussed the assessment and treatment plan with the patient. The patient was provided an opportunity to ask questions and all were answered. The patient agreed with the plan and demonstrated an understanding of the instructions.   The patient was advised to call back or seek an in-person evaluation if the symptoms worsen or if the condition fails to improve as anticipated.  I provided 15 minutes of non-face-to-face time during this encounter.   Dylan Rack, NP   [facility] MD/PA/NP OP Progress Note  01/28/2020 10:59 PM Dylan Flynn  MRN:  [id number]  Evaluation: Dylan Flynn was seen and evaluated via WebEx.  He presents guarded with minimal engagement throughout this assessment. He has a pleasant and bright affect today.  He continues to deny  suicidal or homicidal ideations.  Reports auditory hallucinations have subsided denied that he is heard voices in a while.  Reports he continued to take medications as prescribed.  States he has a follow-up with his psychiatrist at [facility] in 2 weeks.  Reports a good appetite.  States he is resting well throughout the night.  Patient reports learning new coping skills such as mindfulness however states he has not been able to practice any skills learned.  Patient to keep all follow-up outpatient appointments. Patient is taken Clozapine 100 mg, Lithium 900 mg and Zyprexa 5 mg, Patient is unable to recall if he was to continue Viibryd 40mg . Patient denies headaches, nausea or dizziness with medications.  Continue partial hospitalization programming. Support, encouragement and  reassurance was provided.  Visit Diagnosis: No diagnosis found.  Past Psychiatric History:   Past Medical History:  Past Medical History:  Diagnosis Date   Asthma    Schizophrenia (HCC)    Seizures (HCC)    History reviewed. No pertinent surgical history.  Family Psychiatric History:  Family History:  Family History  Problem Relation Age of Onset   Diabetes Mother    Dementia Maternal Grandfather     Social History:  Social History   Socioeconomic History   Marital status: Single    Spouse name: Not on file   Number of children: Not on file   Years of education: Not on file   Highest education level: Not on file  Occupational History   Not on file  Tobacco Use   Smoking status: Never Smoker   Smokeless tobacco: Never Used  Vaping Use   Vaping Use: Never used  Substance and Sexual Activity   Alcohol use: Never   Drug use: Never   Sexual activity: Never  Other Topics Concern   Not on file  Social History Narrative   Not on file   Social Determinants of Health   Financial Resource Strain:    Difficulty of Paying Living Expenses: Not on file  Food Insecurity:    Worried About in the Last Year: Not on file   Programme researcher, broadcasting/film/video of Food in the Last Year: Not on file  Transportation Needs:    Lack of Transportation (Medical): Not on file   Lack of Transportation (Non-Medical): Not on file  Physical Activity:    Days of  Exercise per Week: Not on file   Minutes of Exercise per Session: Not on file  Stress:    Feeling of Stress : Not on file  Social Connections:    Frequency of Communication with Friends and Family: Not on file   Frequency of Social Gatherings with Friends and Family: Not on file   Attends Religious Services: Not on file   Active Member of Clubs or Organizations: Not on file   Attends Banker Meetings: Not on file   Marital Status: Not on file    Allergies:  Allergies  Allergen  Reactions   Other     Tree nuts   Shellfish Allergy     Metabolic Disorder Labs: Lab Results  Component Value Date   HGBA1C 5.6 12/07/2019   MPG 114.02 12/07/2019   No results found for: PROLACTIN Lab Results  Component Value Date   CHOL 213 (H) 12/07/2019   TRIG 91 12/07/2019   HDL 35 (L) 12/07/2019   CHOLHDL 6.1 12/07/2019   VLDL 18 12/07/2019   LDLCALC 160 (H) 12/07/2019   Lab Results  Component Value Date   TSH 13.292 (H) 12/07/2019   TSH 1.581 05/15/2017    Therapeutic Level Labs: Lab Results  Component Value Date   LITHIUM 0.37 (L) 12/07/2019   LITHIUM 0.49 (L) 10/06/2019   Lab Results  Component Value Date   VALPROATE 21 (L) 12/07/2019   VALPROATE 38 (L) 10/06/2019   No components found for:  CBMZ  Current Medications: Current Outpatient Medications  Medication Sig Dispense Refill   albuterol (VENTOLIN HFA) 108 (90 Base) MCG/ACT inhaler Inhale 1-2 puffs into the lungs every 6 (six) hours as needed for wheezing or shortness of breath. 1 Inhaler 11   cloZAPine (CLOZARIL) 100 MG tablet Take 1.5 tablets (150 mg total) by mouth 2 (two) times daily. 30 tablet 0   lithium carbonate (ESKALITH) 450 MG CR tablet Take 900 mg by mouth at bedtime.     OLANZapine (ZYPREXA) 5 MG tablet Take 1 tablet (5 mg total) by mouth daily as needed (hallucinations). 15 tablet 0   valproic acid (DEPAKENE) 250 MG capsule Take 2 capsules (500 mg total) by mouth 2 (two) times daily. 120 capsule 0   diclofenac Sodium (VOLTAREN) 1 % GEL Apply 2 g topically 4 (four) times daily. (Patient not taking: Reported on 01/28/2020) 100 g 1   loperamide (IMODIUM) 2 MG capsule TAKE 1 CAPSULE BY MOUTH AS NEEDED FOR FOR DIARRHEA OR LOOSE STOOLS. (Patient not taking: Reported on 11/21/2019) 30 capsule 0   VIIBRYD 40 MG TABS Take 40 mg by mouth daily. (Patient not taking: Reported on 11/21/2019)     No current facility-administered medications for this visit.      Musculoskeletal:   Psychiatric Specialty Exam: Review of Systems  Height 5\' 8"  (1.727 m), weight 300 lb (136.1 kg).Body mass index is 45.61 kg/m.  General Appearance: Casual  Eye Contact:  Fair  Speech:  Clear and Coherent  Volume:  Normal  Mood:  Euthymic  Affect:  Congruent  Thought Process:  Coherent  Orientation:  Full (Time, Place, and Person)  Thought Content: Hallucinations: Auditory   Suicidal Thoughts:  No  Homicidal Thoughts:  No  Memory:  Immediate;   Fair Recent;   Fair  Judgement:  Fair  Insight:  Fair  Psychomotor Activity:  Normal  Concentration:  Concentration: Fair  Recall:  of Knowledge: Fair  Language: Fair  Akathisia:  No  Handed:  Right  AIMS (if indicated):   Assets:  Communication Skills Desire for Improvement Resilience Social Support  ADL's:  Intact  Cognition: WNL  Sleep:  Good   Screenings: PHQ2-9     Office Visit from 11/18/2019 in [facility] Office Visit from 10/25/2019 in [facility] Office Visit from 08/26/2019 in [facility] Office Visit from 12/12/2018 in [facility] Office Visit from 11/06/2018 in [facility]  PHQ-2 Total Score 0 0 0 0 0  PHQ-9 Total Score 0 -- -- -- --       Assessment and Plan:  -Continue [place] Partial Hospitalization Program -Continue medications as directed -Follow-up with labs i.e. WBC, Lithium Level and Depakene level for medication management.    Treatment plan was reviewed and agreed upon by NP T. Eathel Pajak and patient Dylan Flynn need for continued group services   Dylan Rack, NP 01/28/2020, 10:59 PM

## 2020-01-29 ENCOUNTER — Ambulatory Visit (HOSPITAL_COMMUNITY): Payer: Medicare HMO

## 2020-01-30 ENCOUNTER — Other Ambulatory Visit: Payer: Self-pay

## 2020-01-30 ENCOUNTER — Ambulatory Visit (HOSPITAL_COMMUNITY): Payer: Medicare HMO

## 2020-01-30 NOTE — Psych (Addendum)
Virtual Visit via Video Note  I connected with Dylan Flynn on 01/27/20 at  9:00 AM EDT by a video enabled telemedicine application and verified that I am speaking with the correct person using two identifiers.   I discussed the limitations of evaluation and management by telemedicine and the availability of in person appointments. The patient expressed understanding and agreed to proceed.  Location: Provider: Clinical Home Office Patient: Home   Follow Up Instructions:    I discussed the assessment and treatment plan with the patient. The patient was provided an opportunity to ask questions and all were answered. The patient agreed with the plan and demonstrated an understanding of the instructions.   The patient was advised to call back or seek an in-person evaluation if the symptoms worsen or if the condition fails to improve as anticipated.  I provided 240 minutes of non-face-to-face time during this encounter.   [name], [facility]     [facility] PHP THERAPIST PROGRESS NOTE  Dylan Flynn [id number]  Session Time: 9:00-10:00  Participation Level: Active  Behavioral Response: CasualAlertDepressed  Type of Therapy: Group Therapy  Treatment Goals addressed: Coping  Interventions: CBT, DBT, Solution Focused, Strength-based, Supportive and Reframing  Summary: Clinician led check-in regarding current stressors and situation, and review of patient completed daily inventory. Clinician utilized active listening and empathetic response and validated patient emotions. Clinician facilitated processing group on pertinent issues.   Therapist Response: Dylan Flynn is a 29 y.o. male who presents with auditory hallucinations, depression, and anxiety.  Patient arrived within time allowed and reports that he is feeling "fine." Patient rates his mood at a 7 on a scale of 1-10 with 10 being great. Pt reports his sleep and appetite are both "good." Pt reports he celebrated his  grandmother's birthday over the weekend. Pt reports he tried positive journaling as discussed last week.  Patient engaged in discussion when directly asked questions.      Session Time: 10:00- 11:00   Participation Level: Active   Behavioral Response: CasualAlertDepressed   Type of Therapy: Group Therapy   Treatment Goals addressed: Coping   Interventions: CBT, DBT, Supportive, Reframing, and Strengths Based   Summary: Clinician showed TedTalk on mindfulness and the group discussed.     Therapist Response: Patient engaged in group. 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     Session Time: 11:00 -12:00   Participation Level: Active   Behavioral Response: CasualAlertDepressed   Type of Therapy: Group Therapy, psychotherapy   Treatment Goals addressed: Coping   Interventions: CBT, DBT, Supportive, Reframing, and Strengths Based   Summary:  Clinician continued topic of Mindfulness. Patient participated in mindful activities and discussed timed practice vs practice in real world scenarios. Patients identified what types of activities would help them practice mindfulness.    Therapist Response: Patient engaged in group. Patient identified the following areas to practice mindfulness on a daily basis: showering, cleaning, running       Session Time: 12:00 -1:00   Participation Level: Active   Behavioral Response: CasualAlertDepressed   Type of Therapy: Group therapy   Treatment Goals addressed: Coping   Interventions: CBT; Solution focused; Supportive; Reframing   Summary: 12:00 - 12:50: Clinician introduced topic of "Radical Acceptance."  Group discussed how and when radical acceptance can be used and helpful.   Therapist Response: 12:00 - 12:50: Pt engaged in activity and reports radical acceptance could be helpful in numerous ways. 12:50 - 1:00: At check-out,  patient rates his mood at an 8 on a scale of 1-10  with 10 being great. Pt reports plans of cleaning this afternoon and will practice mindfulness while doing so.  Patient demonstrates some progress as evidenced by increased willingness to try new skills outside of group. Patient denies SI/HI/self-harm at the end of group.  Suicidal/Homicidal: Nowithout intent/plan   Plan: Pt will continue in PHP while working to decrease depression and anxiety symptoms, increase ability to manage symptoms in a healthy manner.  Diagnosis: Undifferentiated schizophrenia (HCC) [F20.3]    1. Undifferentiated schizophrenia (HCC)     [name], [facility], LCASA 01/27/2020

## 2020-01-30 NOTE — Psych (Addendum)
Virtual Visit via Video Note  I connected with Dylan Flynn on 01/24/20 at  9:00 AM EDT by a video enabled telemedicine application and verified that I am speaking with the correct person using two identifiers.   I discussed the limitations of evaluation and management by telemedicine and the availability of in person appointments. The patient expressed understanding and agreed to proceed.  Location: Provider: Clinical Home Office Patient: Home   Follow Up Instructions:    I discussed the assessment and treatment plan with the patient. The patient was provided an opportunity to ask questions and all were answered. The patient agreed with the plan and demonstrated an understanding of the instructions.   The patient was advised to call back or seek an in-person evaluation if the symptoms worsen or if the condition fails to improve as anticipated.  I provided 240 minutes of non-face-to-face time during this encounter.   [name], [facility]     [facility] PHP THERAPIST PROGRESS NOTE  Dylan Flynn [id number]  Session Time: 9:00-10:00  Participation Level: Active  Behavioral Response: CasualAlertDepressed  Type of Therapy: Group Therapy  Treatment Goals addressed: Coping  Interventions: CBT, DBT, Solution Focused, Strength-based, Supportive and Reframing  Summary: Clinician led check-in regarding current stressors and situation, and review of patient completed daily inventory. Clinician utilized active listening and empathetic response and validated patient emotions. Clinician facilitated processing group on pertinent issues.   Therapist Response: Dylan Flynn is a 29 y.o. male who presents with auditory hallucinations, depression, and anxiety.  Patient arrived within time allowed and reports that he is feeling "good." Patient rates his mood at a 7 on a scale of 1-10 with 10 being great. Pt reports his sleep and appetite are both "good." Pt reports he [place]. Pt reports he forgot to do his positive psychology task but does ask clarifying questions. Pt discusses things he does when depressed to help him and the need for more options.  Patient engaged in discussion when directly asked questions.      Session Time: 10:00- 11:00   Participation Level: Active   Behavioral Response: CasualAlertDepressed   Type of Therapy: Group Therapy   Treatment Goals addressed: Coping   Interventions: CBT, DBT, Supportive, Reframing, and Strengths Based   Summary: Clinician led the group in a discussion about the different parts of the mind (wise, rational, emotional).   Therapist Response: Patient engaged in group. Patient reports understanding of the different parts of the mind. Patient identified living in different areas of the mind depending on the situation at hand.      Session Time: 11:00 -12:00   Participation Level: Active   Behavioral Response: CasualAlertDepressed   Type of Therapy: Group Therapy, psychotherapy   Treatment Goals addressed: Coping   Interventions: CBT, DBT, Supportive, Reframing, and Strengths Based   Summary:  Clinician led psychoeducation group on distress tolerance. The STOP skill was introduced, and patients discussed how to utilize it. Patients identified when this technique may be helpful in their personal lives. Group discussed "TIP" and how/when patients can employ this method to help.  Patients identified when this technique may be helpful in their personal lives.    Therapist Response: Patient engaged in group. Patient identified both STOP and TIP skills as being useful and reports he will employ when needed.       Session Time: 12:00 -1:00   Participation Level: Active   Behavioral Response: CasualAlertDepressed   Type of  Therapy: Group therapy   Treatment Goals addressed: Coping   Interventions: CBT; Solution focused; Supportive; Reframing   Summary: 12:00 - 12:50: Clinician  continued topic of "Distress Tolerance". Group discussed "ACCEPTS" skills and Self Soothe and how/when patients can employ these methods to help.  Patients identified when these techniques may be helpful in their personal lives. 12:50 -1:00 Clinician led check-out. Clinician assessed for immediate needs, medication compliance and efficacy, and safety concerns   Therapist Response: 12:00 - 12:50: Pt engaged in activity and reports STOP skill would be most helpful for him in the future. Pt participated in creating Top 5 lists to remind self of skills he can employ in times of need.  12:50 - 1:00: At check-out, patient rates his mood at a 9 on a scale of 1-10 with 10 being great. Pt reports he is "fantastic" and doesn't have specific plans for the weekend. Patient demonstrates some progress as evidenced by increased participation in group without direct questioning. Patient denies SI/HI/self-harm at the end of group.  Suicidal/Homicidal: Nowithout intent/plan   Plan: Pt will continue in PHP while working to decrease depression and anxiety symptoms, increase ability to manage symptoms in a healthy manner.  Diagnosis: Undifferentiated schizophrenia (HCC) [F20.3]    1. Undifferentiated schizophrenia (HCC)     [name], [facility], LCASA 01/24/2020

## 2020-01-30 NOTE — Psych (Addendum)
Virtual Visit via Video Note  I connected with Dylan Flynn on 01/28/20 at  9:00 AM EDT by a video enabled telemedicine application and verified that I am speaking with the correct person using two identifiers.   I discussed the limitations of evaluation and management by telemedicine and the availability of in person appointments. The patient expressed understanding and agreed to proceed.  Location: Provider: Clinical Home Office Patient: Home   Follow Up Instructions:    I discussed the assessment and treatment plan with the patient. The patient was provided an opportunity to ask questions and all were answered. The patient agreed with the plan and demonstrated an understanding of the instructions.   The patient was advised to call back or seek an in-person evaluation if the symptoms worsen or if the condition fails to improve as anticipated.  I provided 240 minutes of non-face-to-face time during this encounter.   [name], [facility]     [facility] PHP THERAPIST PROGRESS NOTE  Dylan Flynn [id number]  Session Time: 9:00-10:00  Participation Level: Active  Behavioral Response: CasualAlertDepressed  Type of Therapy: Group Therapy  Treatment Goals addressed: Coping  Interventions: CBT, DBT, Solution Focused, Strength-based, Supportive and Reframing  Summary: Clinician led check-in regarding current stressors and situation, and review of patient completed daily inventory. Clinician utilized active listening and empathetic response and validated patient emotions. Clinician facilitated processing group on pertinent issues.   Therapist Response: Dylan Flynn is a 29 y.o. male who presents with auditory hallucinations, depression, and anxiety.  Patient arrived within time allowed and reports that he is feeling "good." Patient rates his mood at a 7 on a scale of 1-10 with 10 being great. Pt reports his sleep and appetite are both "good." Pt reports he exercised and cleaned  yesterday and practiced mindfulness while doing so. Pt reports it was a "weird" sensation paying attention to every movement but was good.  Pt shows continued progress by continued use of skills outside of group. Patient engaged in discussion when directly asked questions.      Session Time: 10:00- 11:00   Participation Level: Active   Behavioral Response: CasualAlertDepressed   Type of Therapy: Group Therapy   Treatment Goals addressed: Coping   Interventions: CBT, DBT, Supportive, Reframing, and Strengths Based   Summary: Clinician introduced topic of "Values". Group participated in a "[employer]" exercise to identify values.    Therapist Response: Patient engaged in group. 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     Session Time: 11:00 -12:00   Participation Level: Active   Behavioral Response: CasualAlertDepressed   Type of Therapy: Group Therapy, psychotherapy   Treatment Goals addressed: Coping   Interventions: CBT, DBT, Supportive, Reframing, and Strengths Based   Summary:  Clinician continued topic of Values. Group discussed why values are important and how to find out what values are most important to self. Group completed a values worksheet to help.  Group discussed how goals and values are related.    Therapist Response: Patient engaged in group. Patient discussed why some values are more important than others.       Session Time: 12:00 -1:00   Participation Level: Active   Behavioral Response: CasualAlertDepressed   Type of Therapy: Group therapy   Treatment Goals addressed: Coping   Interventions: CBT; Solution focused; Supportive; Reframing   Summary: 12:00 - 12:50: Clinician continued topic of Values. Pt discussed their values sort and what values are most important.  Pt's were able to narrow down to top 3 then top 1 value and identify which part of the value they want to focus on. Pt  identified "Attend to Relationships" as #1 value he wants to focus on at this time.   Therapist Response: 12:00 - 12:50: Pt engaged in activity and reports radical acceptance could be helpful in numerous ways. 12:50 - 1:00: At check-out, patient rates his mood at a 9 on a scale of 1-10 with 10 being great. Pt reports no specific plans this afternoon.  Patient demonstrates some progress as evidenced by increased insight into values and why understanding values are important. Patient denies SI/HI/self-harm at the end of group.  Suicidal/Homicidal: Nowithout intent/plan   Plan: Pt will continue in PHP while working to decrease depression and anxiety symptoms, increase ability to manage symptoms in a healthy manner.  Diagnosis: Undifferentiated schizophrenia (HCC) [F20.3]    1. Undifferentiated schizophrenia (HCC)     [name], [facility], LCASA 01/28/2020

## 2020-01-30 NOTE — Psych (Addendum)
Virtual Visit via Video Note  I connected with Dylan Flynn on 01/23/20 at  9:00 AM EDT by a video enabled telemedicine application and verified that I am speaking with the correct person using two identifiers.   I discussed the limitations of evaluation and management by telemedicine and the availability of in person appointments. The patient expressed understanding and agreed to proceed.  Location: Provider: Clinical Home office Patient: Home  Follow Up Instructions:    I discussed the assessment and treatment plan with the patient. The patient was provided an opportunity to ask questions and all were answered. The patient agreed with the plan and demonstrated an understanding of the instructions.   The patient was advised to call back or seek an in-person evaluation if the symptoms worsen or if the condition fails to improve as anticipated.  I provided 240 minutes of non-face-to-face time during this encounter.   Dylan Flynn, [facility]     [facility] PHP THERAPIST PROGRESS NOTE  Dylan Flynn [id number]  Session Time: 9:00-10:00  Participation Level: Active  Behavioral Response: CasualAlertDepressed  Type of Therapy: Group Therapy  Treatment Goals addressed: Coping  Interventions: CBT, DBT, Solution Focused, Strength-based, Supportive and Reframing  Summary: Clinician led check-in regarding current stressors and situation, and review of patient completed daily inventory. Clinician utilized active listening and empathetic response and validated patient emotions. Clinician facilitated processing group on pertinent issues.   Therapist Response: Dylan Flynn is a 29 y.o. male who presents with auditory hallucinations, depression, and anxiety.  Patient arrived within time allowed and reports that he is feeling "fine and hopeful." Patient rates his mood at a 7 on a scale of 1-10 with 10 being great. Pt reports his sleep and appetite are both "good." Pt reports he is feeling  comfortable being home.  Patient engaged in discussion when directly asked questions.  Session Time: 10:00- 11:00   Participation Level: Active   Behavioral Response: CasualAlertDepressed   Type of Therapy: Group Therapy   Treatment Goals addressed: Coping   Interventions: CBT, DBT, Supportive, Reframing, and Strengths Based   Summary: Clinician led the group in discussing the needs assessment. Patients identified and discussed areas of life where improvements need to be made.   Therapist Response: Patient engaged in group. Patient identified career and education as most pressing areas to work on.      Session Time: 11:00 -12:00   Participation Level: Active   Behavioral Response: CasualAlertDepressed   Type of Therapy: Group Therapy, psychotherapy   Treatment Goals addressed: Coping   Interventions: CBT, DBT, Supportive, Reframing, and Strengths Based   Summary:  Clinician introduced topic of "Positive Psychology". Group watched "Positive Psychology" Ted-Talk. Patients discussed how their "lens" of life effects the way they feel.    Therapist Response: Patient engaged in group. Patient identified connecting with the idea of "moving the goalpost of happiness by moving the goalpost of success."       Session Time: 12:00 -1:00   Participation Level: Active   Behavioral Response: CasualAlertDepressed   Type of Therapy: Group therapy   Treatment Goals addressed: Coping   Interventions: CBT; Solution focused; Supportive; Reframing   Summary: 12:00 - 12:50: Patients participated in trying 5 different ways to change their "lens" to a more positive outlook. Patients identified 1 of the strategies they would be willing to try to change their "lens."  12:50 -1:00 Clinician led check-out. Clinician assessed for immediate needs, medication compliance and efficacy, and safety concerns   Therapist Response: 12:00 -  12:50: Pt engaged in activity and reports he can utilize  positive journaling to change his "lens."  12:50 - 1:00: At check-out, patient rates his mood at a 9 on a scale of 1-10 with 10 being great. Pt reports he "got a lot of out group today." Pt reports afternoon plans of spending time at his home. Patient demonstrates some progress as evidenced by increased mood. Patient denies SI/HI/self-harm at the end of group.  Suicidal/Homicidal: Nowithout intent/plan   Plan: Pt will continue in PHP while working to decrease depression and anxiety symptoms, increase ability to manage symptoms in a healthy manner.  Diagnosis: Undifferentiated schizophrenia (HCC) [F20.3]    1. Undifferentiated schizophrenia (HCC)     Dylan Flynn, [facility], LCASA 01/23/2020

## 2020-01-31 ENCOUNTER — Other Ambulatory Visit: Payer: Self-pay

## 2020-01-31 ENCOUNTER — Ambulatory Visit (HOSPITAL_COMMUNITY): Payer: Medicare HMO

## 2020-02-03 ENCOUNTER — Other Ambulatory Visit: Payer: Self-pay

## 2020-02-03 ENCOUNTER — Ambulatory Visit (HOSPITAL_COMMUNITY): Payer: Medicare HMO

## 2020-02-04 ENCOUNTER — Encounter (HOSPITAL_COMMUNITY): Payer: Self-pay | Admitting: Licensed Clinical Social Worker

## 2020-02-04 ENCOUNTER — Ambulatory Visit (HOSPITAL_COMMUNITY): Payer: Medicare HMO | Admitting: Licensed Clinical Social Worker

## 2020-02-04 NOTE — Progress Notes (Signed)
  [facility] partial hospitalization outpatient Program Discharge Summary  Dylan Flynn [id number]  Admission date: 01/19/2020 Discharge date: 02/05/2020  Reason for admission:Dylan Flynn is a 29 y.o. male. Presented to [facility] behavioral Health accompanied with his mother Dylan Flynn).  Dylan Flynn provided verbal authorization for mother to participate with assessment. Dylan Flynn reports " I had a relapse."  States he has been experiencing auditory hallucinations that is unrelieved with medications.  Patient denies voices are command in nature.  Reported patient started banging closed fist on the table, when the voices get too loud.  He reports a history of schizophrenia. Patient denied suicidal or homicidal ideation.  Denies visual hallucinations.  Currently denying auditory hallucinations states experiencing worsening hallucinations this morning.   Chemical Use History: Denied  Family of Origin Issues: Dylan Flynn was evaluated at the behavioral health outpatient facility.  He was accompanied by his mother who appeared to be supportive with patient's reported symptoms and with patient seeking additional outpatient resources.  Progress in Program Toward Treatment Goals: Ongoing, patient attended and participated with daily group session with active and engaged participation for the first week during this program.  As was reported patient had multiple missed days.  Patient to keep follow-up appointment with outpatient providers at [facility].  Progress (rationale): Keep follow-up appointment with outpatient providers.  Take all medications as prescribed. Keep all follow-up appointments as scheduled.  Do not consume alcohol or use illegal drugs while on prescription medications. Report any adverse effects from your medications to your primary care provider promptly.  In the event of recurrent symptoms or worsening symptoms, call 911, a crisis hotline, or go to the nearest emergency  department for evaluation.   [facility]-PHP THERAPIST 02/04/2020

## 2020-02-05 ENCOUNTER — Ambulatory Visit (HOSPITAL_COMMUNITY): Payer: Medicare HMO

## 2020-02-05 ENCOUNTER — Other Ambulatory Visit: Payer: Self-pay

## 2020-02-18 ENCOUNTER — Encounter (HOSPITAL_COMMUNITY): Payer: Self-pay | Admitting: Professional

## 2020-04-21 ENCOUNTER — Ambulatory Visit: Payer: Medicare HMO

## 2020-04-22 ENCOUNTER — Other Ambulatory Visit: Payer: Self-pay

## 2020-04-22 ENCOUNTER — Ambulatory Visit (INDEPENDENT_AMBULATORY_CARE_PROVIDER_SITE_OTHER): Payer: Medicare HMO

## 2020-04-22 DIAGNOSIS — Z23 Encounter for immunization: Secondary | ICD-10-CM

## 2020-04-22 NOTE — Progress Notes (Signed)
   Covid-19 Vaccination Clinic  Name:  Esli Jernigan    MRN: [id number] DOB: 07/22/1990  04/22/2020  Mr. Foutz was observed post Covid-19 immunization for 15 minutes without incident. He was provided with Vaccine Information Sheet and instruction to access the V-Safe system.   Mr. Sherod was instructed to call 911 with any severe reactions post vaccine: Marland Kitchen Difficulty breathing  . Swelling of face and throat  . A fast heartbeat  . A bad rash all over body  . Dizziness and weakness   Booster administered LD without complication.   Patient requested I take his BP "just because." 122/64

## 2020-04-30 ENCOUNTER — Ambulatory Visit (INDEPENDENT_AMBULATORY_CARE_PROVIDER_SITE_OTHER): Payer: Medicare HMO

## 2020-04-30 ENCOUNTER — Other Ambulatory Visit: Payer: Self-pay

## 2020-04-30 DIAGNOSIS — Z23 Encounter for immunization: Secondary | ICD-10-CM | POA: Diagnosis not present

## 2020-05-06 NOTE — Progress Notes (Signed)
Patient presents to nurse clinic for flu vaccination. Administered in RD, site unremarkable.   Veronda Prude, RN

## 2020-07-20 ENCOUNTER — Ambulatory Visit (INDEPENDENT_AMBULATORY_CARE_PROVIDER_SITE_OTHER): Payer: Medicare HMO | Admitting: Family Medicine

## 2020-07-20 ENCOUNTER — Encounter: Payer: Self-pay | Admitting: Family Medicine

## 2020-07-20 ENCOUNTER — Other Ambulatory Visit: Payer: Self-pay

## 2020-07-20 VITALS — BP 118/78 | HR 101 | Ht 68.0 in | Wt 345.4 lb

## 2020-07-20 DIAGNOSIS — Z114 Encounter for screening for human immunodeficiency virus [HIV]: Secondary | ICD-10-CM

## 2020-07-20 DIAGNOSIS — R300 Dysuria: Secondary | ICD-10-CM | POA: Insufficient documentation

## 2020-07-20 DIAGNOSIS — Z1159 Encounter for screening for other viral diseases: Secondary | ICD-10-CM

## 2020-07-20 DIAGNOSIS — R7989 Other specified abnormal findings of blood chemistry: Secondary | ICD-10-CM | POA: Diagnosis not present

## 2020-07-20 LAB — POCT URINALYSIS DIP (MANUAL ENTRY)
Bilirubin, UA: NEGATIVE
Blood, UA: NEGATIVE
Glucose, UA: NEGATIVE mg/dL
Ketones, POC UA: NEGATIVE mg/dL
Leukocytes, UA: NEGATIVE
Nitrite, UA: NEGATIVE
Protein Ur, POC: NEGATIVE mg/dL
Spec Grav, UA: 1.025 (ref 1.010–1.025)
Urobilinogen, UA: 0.2 E.U./dL
pH, UA: 6 (ref 5.0–8.0)

## 2020-07-20 NOTE — Progress Notes (Signed)
    SUBJECTIVE:   CHIEF COMPLAINT / HPI:   Concern for UTI Intermittent burning for several months.  He reports it does not burn throughout the day but sometimes at the end of the day it will burn when he urinates.  Patient says that he drinks plenty of water or fluids in the morning but tends to die off towards the end of the day.  He reports that he sometimes has a rash.  The rash is mainly on his penis but has resolved at this time.  Denies any discharge.  Patient is not sexually active and has never been sexually active.  Reports that he masturbates but does not use any lubricant.  Depression Patient reports that he sees [facility] for his psychiatric care.  He feels like he is doing okay.  He circled 1 on question 9 of the PHQ-9 but said he sometimes feels like he would be better off not around but has no thoughts of SI or HI.   OBJECTIVE:   BP 118/78   Pulse (!) 101   Ht 5\' 8"  (1.727 m)   Wt (!) 345 lb 6.4 oz (156.7 kg)   SpO2 96%   BMI 52.52 kg/m   General: Well-appearing 30 year old male, no acute distress Cardiac: Regular rate and rhythm, no murmurs appreciated Respiratory: Normal breathing GU: Normal male circumcised penis, no rash noted on penis or testicles, no drainage appreciated   ASSESSMENT/PLAN:   Dysuria Patient reports burning with urination for months.  Denies any discharge.  Reports it is not every time he urinates and mainly occurs at the end of the day.  Reports intermittent rash as well but it is not present at this time.  Is not sexually active and has not been sexually active.  UA was reassuring today with no abnormalities.  No rashes appreciated or sign of infection noted.  Discussed adequate hydration and encouraged him to drink more fluids and follow-up as needed.  There is no indication for antibiotic treatment at this time.  Strict return precautions given.    Depression Patient reports his depression is cared for at [facility].  Feels like he is doing good  on current medication regiment.  No thoughts of SI or HI at this time but reports that he occasionally feels like he would be better off not around.  Provided patient with helpline information.  Patient denies any SI or HI at this time.  We will follow-up with primary psychiatric provider if needed.  [facility], MD [facility]

## 2020-07-20 NOTE — Assessment & Plan Note (Signed)
Patient reports burning with urination for months.  Denies any discharge.  Reports it is not every time he urinates and mainly occurs at the end of the day.  Reports intermittent rash as well but it is not present at this time.  Is not sexually active and has not been sexually active.  UA was reassuring today with no abnormalities.  No rashes appreciated or sign of infection noted.  Discussed adequate hydration and encouraged him to drink more fluids and follow-up as needed.  There is no indication for antibiotic treatment at this time.  Strict return precautions given.

## 2020-07-20 NOTE — Patient Instructions (Signed)
It was great seeing you today.  Your rate urine does not appear that you have any infection going on.  It did not show any glucose either meaning I do not think your blood sugars are the problem.  I want you to make sure you drink plenty of water throughout the day and hopefully this will help with the symptoms.  I did not see any rash or any indication that she had an infection that needs to be treated at this time.  If the symptoms return please feel free to call the clinic and schedule another appointment.  Regarding your thyroid hormone I am going to check that today and these results will be posted to your MyChart.  I am glad your psychiatric medications are helping but if you have any issues or concerns we want to hear from you.  I am also going to provide the helpline information if you have thoughts of self harm at any time.  I hope you have a wonderful afternoon!   If you are feeling suicidal or depression symptoms worsen please immediately go to:   24 Hour Availability [facility]  [address] [place], [place] [phone] Crisis [phone]    . If you are thinking about harming yourself or having thoughts of suicide, or if you know someone who is, seek help right away. . Call your doctor or mental health care provider. . Call 911 or go to a hospital emergency room to get immediate help, or ask a friend or family member to help you do these things. . Call the [place] National Suicide Prevention Lifeline's toll-free, 24-hour hotline at 1-800-273-TALK 9046617911) or TTY: 1-800-799-4 TTY [phone]) to talk to a trained counselor. . If you are in crisis, make sure you are not left alone.  . If someone else is in crisis, make sure he or she is not left alone   [facility] [employer] (Domestic Violence, Rape & Victim Assistance [phone]  [facility]    (ONLY from 8am-4pm)     [phone]  [facility] Mobile Crisis Unit (24/7)   [phone]  [place] National Suicide Hotline   [phone] Len Childs)

## 2020-07-22 LAB — TSH+FREE T4
Free T4: 1 ng/dL (ref 0.82–1.77)
TSH: 10.4 u[IU]/mL — ABNORMAL HIGH (ref 0.450–4.500)

## 2020-07-22 LAB — HEPATITIS C ANTIBODY: Hep C Virus Ab: 0.1 s/co ratio (ref 0.0–0.9)

## 2020-07-22 LAB — HIV ANTIBODY (ROUTINE TESTING W REFLEX): HIV Screen 4th Generation wRfx: NONREACTIVE

## 2021-02-20 IMAGING — DX DG KNEE 1-2V PORT*L*
4 series · 4 of 4 positions shown · non-contrast
Comparison: None.

CLINICAL DATA: Left knee pain.

EXAM:
PORTABLE LEFT KNEE - 1-2 VIEW

[knee ap]
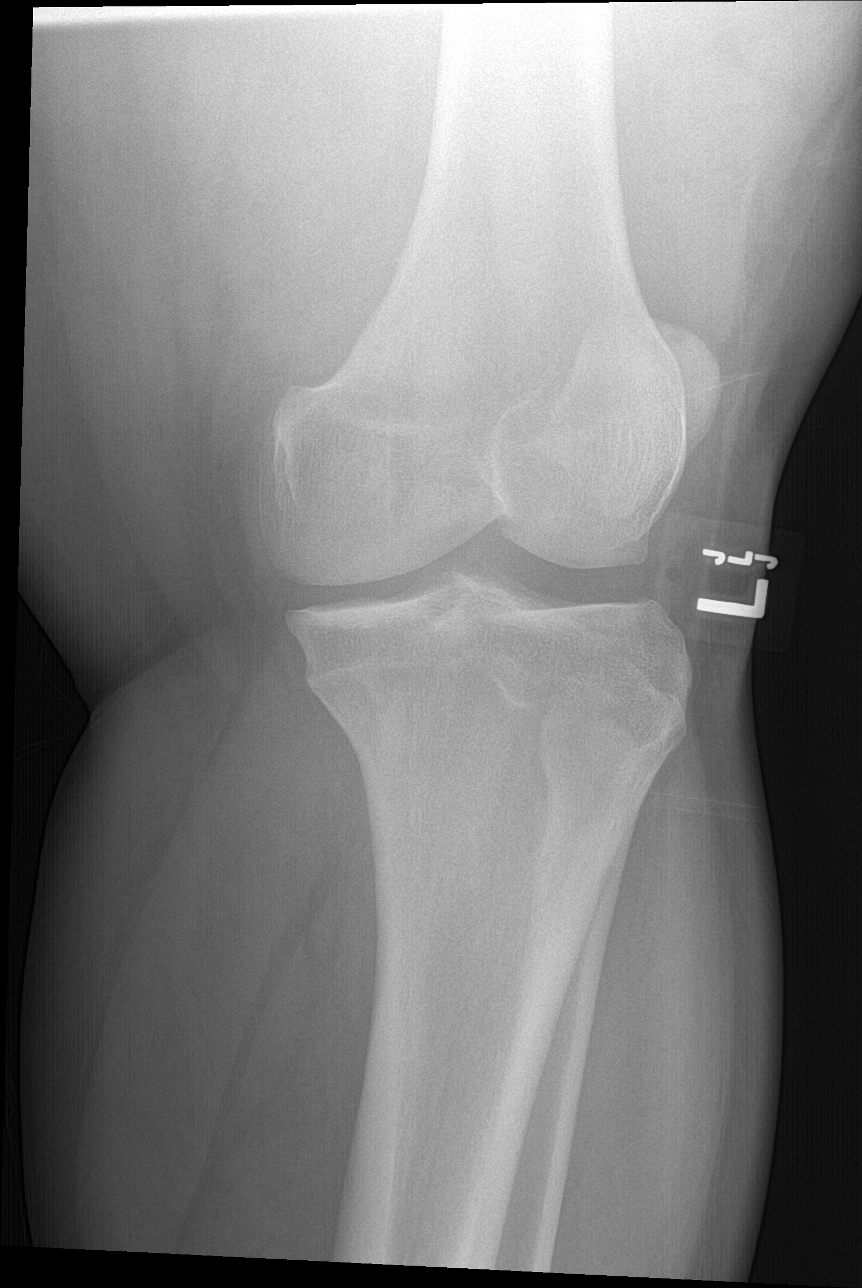

[knee obl (1 of 2)]
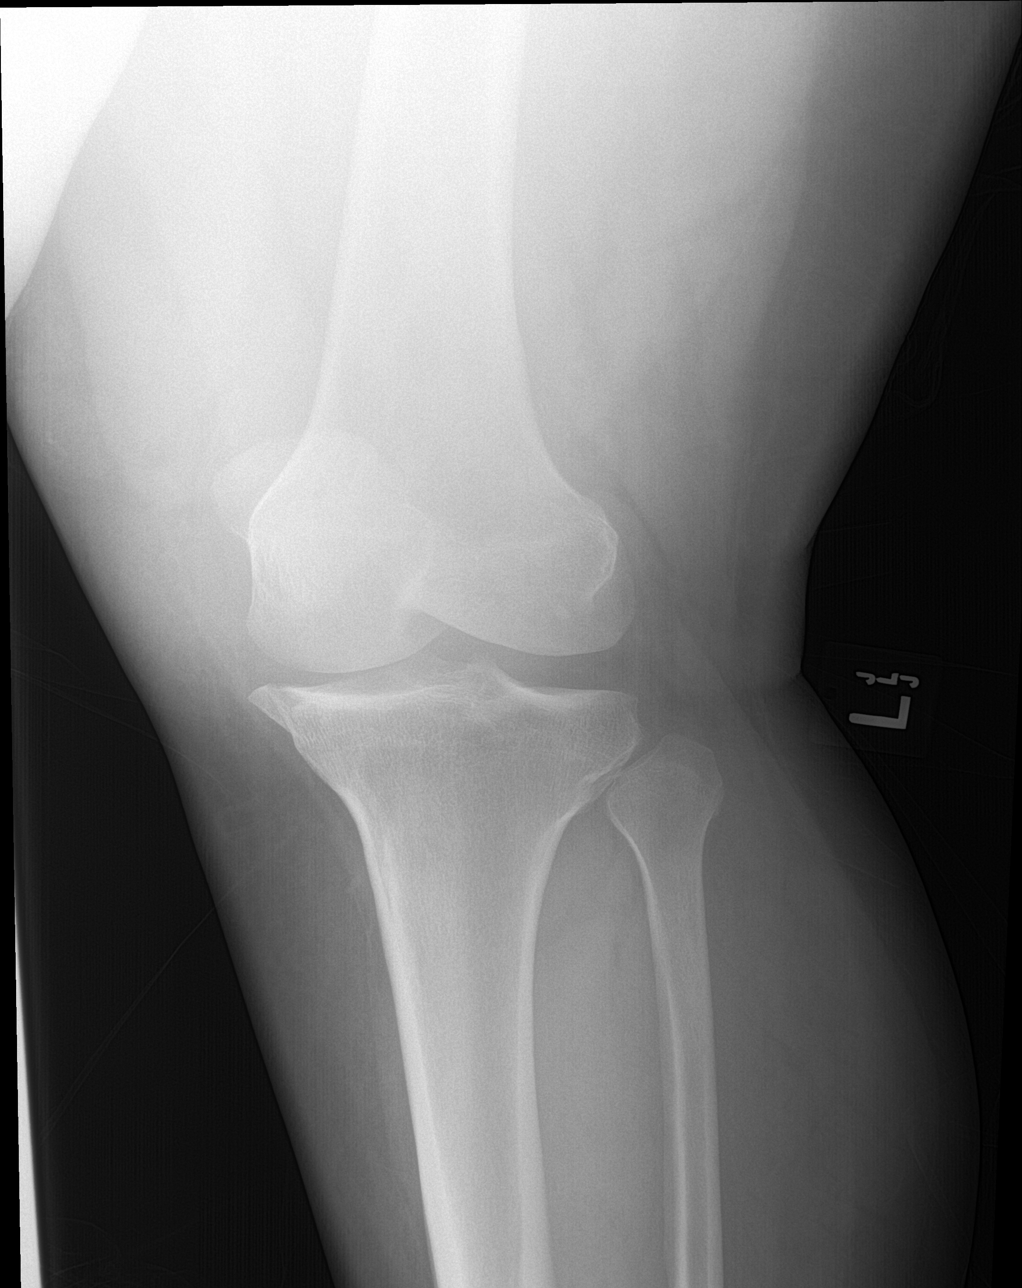

[knee obl (2 of 2)]
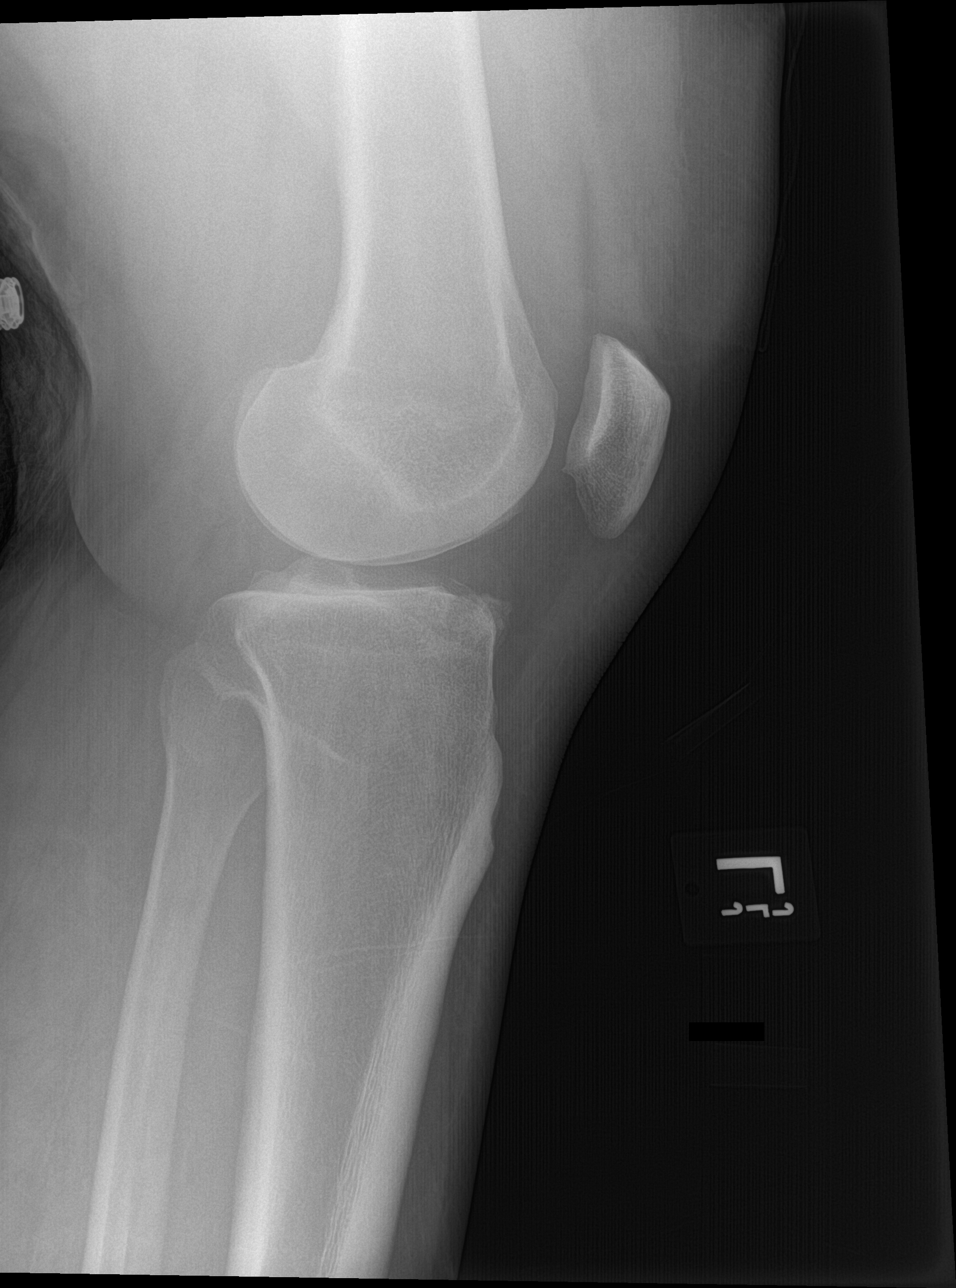

[knee lat]
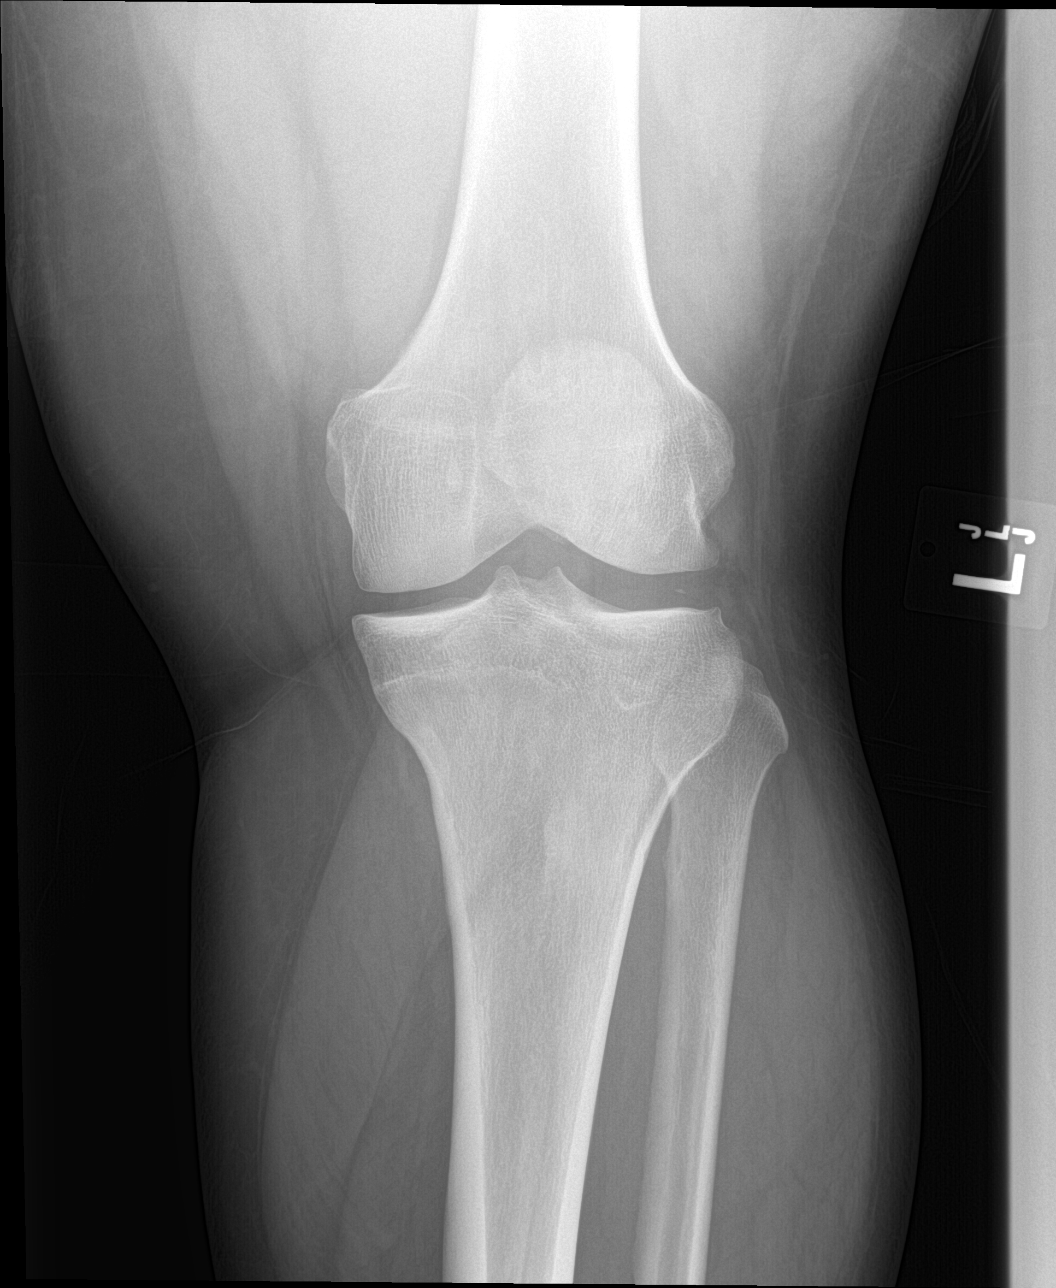

[4 of 4 positions shown; findings below may reference images not displayed]

FINDINGS: No evidence of acute fracture. No dislocation. There is medial
tibiofemoral joint space narrowing with peripheral spurring. Tiny
patellofemoral spurs. Small knee joint effusion.
IMPRESSION: 1. No evidence of acute fracture or dislocation.
2. Mild but age advanced degenerative change.

## 2021-09-07 ENCOUNTER — Ambulatory Visit (INDEPENDENT_AMBULATORY_CARE_PROVIDER_SITE_OTHER): Payer: Medicare HMO | Admitting: Family Medicine

## 2021-09-07 VITALS — BP 124/80 | HR 108 | Ht 68.0 in | Wt 336.0 lb

## 2021-09-07 DIAGNOSIS — L0591 Pilonidal cyst without abscess: Secondary | ICD-10-CM

## 2021-09-07 MED ORDER — AMOXICILLIN-POT CLAVULANATE 875-125 MG PO TABS: 1.0000 | ORAL_TABLET | Freq: Two times a day (BID) | ORAL | 0 refills | Status: DC

## 2021-09-07 NOTE — Progress Notes (Signed)
? ? ?  SUBJECTIVE:  ? ?CHIEF COMPLAINT / HPI: sore on bottom ? ?Painful area on butt for about 1-2 weeks.  Popped last night and started draining.  Has had similar symptoms in past but usually resolve after a couple of days.  Denies any fevers.  Endorses pain with certain seated positions. ? ?PERTINENT  PMH / PSH:  ?Schizophrenia ?Obesity class 3 ? ? ?OBJECTIVE:  ? ?BP 124/80   Pulse (!) 108   Ht 5\' 8"  (1.727 m)   Wt (!) 336 lb (152.4 kg)   SpO2 97%   BMI 51.09 kg/m?   ? ?General: Alert, no acute distress ?Derm: open draining area on right upper intergluteal fold with surrounding induration and erythema.  No fluctuation.  Mild surrounding edema.  Probed and no notable pockets or sinus tracking. ? ? ?ASSESSMENT/PLAN:  ? ?Pilonidal cyst without abscess ?Draining cyst.   ?Augmentin 875-125 mg BID x 10 days ?Warm compresses 4-5 times daily ?Follow up appointment scheduled for May 12 ?Strict return precautions provided ?  ? ? ?May 14, MD ?[facility]  ?

## 2021-09-07 NOTE — Patient Instructions (Signed)
Thank you for coming to see me today. It was a pleasure.  ? ?Use warm compresses 4-5 times daily.  Is important to do this frequently to keep the area draining. ?Antibiotics take 1 tablet twice daily for 10 days ? ?Please follow-up with me on Friday May 12 at 1:30 PM ? ?If you have any questions or concerns, please do not hesitate to call the office at [phone]. ? ?Best,  ? ?Dana Allan, MD   ?

## 2021-09-08 ENCOUNTER — Encounter: Payer: Self-pay | Admitting: Family Medicine

## 2021-09-08 DIAGNOSIS — L0591 Pilonidal cyst without abscess: Secondary | ICD-10-CM

## 2021-09-08 HISTORY — DX: Pilonidal cyst without abscess: L05.91

## 2021-09-08 NOTE — Assessment & Plan Note (Signed)
Draining cyst.   ?Augmentin 875-125 mg BID x 10 days ?Warm compresses 4-5 times daily ?Follow up appointment scheduled for May 12 ?Strict return precautions provided ?

## 2021-09-10 ENCOUNTER — Ambulatory Visit: Payer: Medicare HMO | Admitting: Family Medicine

## 2021-09-15 ENCOUNTER — Ambulatory Visit (INDEPENDENT_AMBULATORY_CARE_PROVIDER_SITE_OTHER): Payer: Medicare HMO | Admitting: Family Medicine

## 2021-09-15 ENCOUNTER — Encounter: Payer: Self-pay | Admitting: Family Medicine

## 2021-09-15 VITALS — BP 121/88 | HR 110 | Ht 68.0 in | Wt 330.0 lb

## 2021-09-15 DIAGNOSIS — L0591 Pilonidal cyst without abscess: Secondary | ICD-10-CM

## 2021-09-15 NOTE — Patient Instructions (Signed)
Thank you for coming to see me today. It was a pleasure.  ? ?The area is healing quite nicely.  Continue your biotics until completed.  Continue warm compresses as needed. ?If area begins to become more painful, starts to drain or becomes more firm and you develop fevers please schedule appointment to see PCP. ? ?Please follow-up with PCP as needed ? ?If you have any questions or concerns, please do not hesitate to call the office at [phone]. ? ?Best,  ? ?Dana Allan, MD   ?

## 2021-09-15 NOTE — Progress Notes (Signed)
    SUBJECTIVE:   CHIEF COMPLAINT / HPI: follow up cyst  Presents for follow up for pilonidal cyst. Seen in clinic on 05/09 and treated with Augmentin.  Since then patient reports improvement in symptoms.  Denies any fevers, drainage or pain.  Almost completed course of antibiotics  PERTINENT  PMH / PSH:  None  OBJECTIVE:   BP 121/88   Pulse (!) 110   Ht 5\' 8"  (1.727 m)   Wt (!) 330 lb (149.7 kg)   SpO2 96%   BMI 50.18 kg/m    General: Alert, no acute distress Buttock: Chaperone present: Paige Healing pilonidal cyst without fluctuation, erythema, edema, induration or drainage   ASSESSMENT/PLAN:   Pilonidal cyst without abscess Resolving  Complete course of antibiotics. Continue warm compresses Follow up with PCP if symptoms return     , MD [facility]

## 2021-09-19 ENCOUNTER — Encounter: Payer: Self-pay | Admitting: Family Medicine

## 2021-09-19 NOTE — Assessment & Plan Note (Signed)
Resolving  Complete course of antibiotics. Continue warm compresses Follow up with PCP if symptoms return

## 2021-10-05 ENCOUNTER — Encounter: Payer: Self-pay | Admitting: *Deleted

## 2022-06-05 ENCOUNTER — Other Ambulatory Visit: Payer: Self-pay

## 2022-06-05 ENCOUNTER — Ambulatory Visit (HOSPITAL_COMMUNITY)
Admission: EM | Admit: 2022-06-05 | Discharge: 2022-06-05 | Disposition: A | Discharge status: Home / Self Care | Payer: Medicare HMO

## 2022-06-05 ENCOUNTER — Encounter (HOSPITAL_COMMUNITY): Payer: Self-pay | Admitting: *Deleted

## 2022-06-05 DIAGNOSIS — H5711 Ocular pain, right eye: Secondary | ICD-10-CM | POA: Diagnosis not present

## 2022-06-05 MED ORDER — TETRACAINE HCL 0.5 % OP SOLN
OPHTHALMIC | Status: AC
  Filled 2022-06-05: qty 4

## 2022-06-05 NOTE — ED Provider Notes (Signed)
[place]    CSN: [id number] Arrival date & time: 06/05/22  1328      History   Chief Complaint Chief Complaint  Patient presents with   Eye Problem    HPI Dylan Flynn is a 32 y.o. male.   Eye pain R eye x today, he thinks something got into his eye when he was getting ready in the morning.  Pain located nasal side, upper eyelid.  Notes watery discharge, denies vision changes, vision loss, painful EOM.  He reports sx are improving.    Past Medical History:  Diagnosis Date   Asthma    Schizophrenia ([place])    Seizures ([place])     Patient Active Problem List   Diagnosis Date Noted   Pilonidal cyst without abscess 09/08/2021   Dysuria 07/20/2020   Acute pain of left knee 10/25/2019   Suicidal ideation    Localized swelling, mass and lump, neck 08/26/2019   Intertrigo 02/04/2019   Healthcare maintenance 02/04/2019   Cellulitis of head except face 12/13/2018   Asthma    Schizophrenia ([place])    Diarrhea due to drug     History reviewed. No pertinent surgical history.     Home Medications    Prior to Admission medications   Medication Sig Start Date End Date Taking? Authorizing Provider  albuterol (VENTOLIN HFA) 108 (90 Base) MCG/ACT inhaler Inhale 1-2 puffs into the lungs every 6 (six) hours as needed for wheezing or shortness of breath. 10/06/18   Vanessa Kick, MD  amoxicillin-clavulanate (AUGMENTIN) 875-125 MG tablet Take 1 tablet by mouth 2 (two) times daily. 09/07/21   Carollee Leitz, MD  cloZAPine (CLOZARIL) 100 MG tablet Take 1.5 tablets (150 mg total) by mouth 2 (two) times daily. 09/27/19   Starkes-Perry, Gayland Curry, FNP  diclofenac Sodium (VOLTAREN) 1 % GEL Apply 2 g topically 4 (four) times daily. Patient not taking: Reported on 01/28/2020 11/18/19   Delora Fuel, MD  lithium carbonate (ESKALITH) 450 MG CR tablet Take 900 mg by mouth at bedtime. 11/11/19   [provider]  loperamide (IMODIUM) 2 MG capsule TAKE 1 CAPSULE BY MOUTH AS NEEDED FOR  FOR DIARRHEA OR LOOSE STOOLS. Patient not taking: Reported on 11/21/2019 08/21/19   Kathrene Alu, MD  OLANZapine (ZYPREXA) 5 MG tablet Take 1 tablet (5 mg total) by mouth daily as needed (hallucinations). 12/07/19 12/06/20  Connye Burkitt, NP  valproic acid (DEPAKENE) 250 MG capsule Take 2 capsules (500 mg total) by mouth 2 (two) times daily. 12/07/19   Connye Burkitt, NP  VIIBRYD 40 MG TABS Take 40 mg by mouth daily. Patient not taking: Reported on 11/21/2019 09/24/19   [provider]    Family History Family History  Problem Relation Age of Onset   Diabetes Mother    Dementia Maternal Grandfather     Social History Social History   Tobacco Use   Smoking status: Never   Smokeless tobacco: Never  Vaping Use   Vaping Use: Never used  Substance Use Topics   Alcohol use: Never   Drug use: Never     Allergies   Other and Shellfish allergy   Review of Systems Review of Systems  Constitutional:  Negative for chills, fatigue and fever.  HENT:  Negative for congestion, ear pain, nosebleeds, postnasal drip, rhinorrhea, sinus pressure, sinus pain and sore throat.   Eyes:  Positive for pain and discharge. Negative for photophobia, redness, itching and visual disturbance.  Respiratory:  Negative for cough, shortness  of breath and wheezing.   Gastrointestinal:  Negative for abdominal pain, diarrhea, nausea and vomiting.  Musculoskeletal:  Negative for arthralgias and myalgias.  Skin:  Negative for rash.  Neurological:  Negative for light-headedness and headaches.  Hematological:  Negative for adenopathy. Does not bruise/bleed easily.  Psychiatric/Behavioral:  Negative for confusion and sleep disturbance.      Physical Exam Triage Vital Signs ED Triage Vitals  Enc Vitals Group     BP 06/05/22 1417 121/84     Pulse Rate 06/05/22 1417 (!) 101     Resp 06/05/22 1417 20     Temp 06/05/22 1417 98.1 F (36.7 C)     Temp src --      SpO2 06/05/22 1417 92 %     Weight --       Height --      Head Circumference --      Peak Flow --      Pain Score 06/05/22 1416 5     Pain Loc --      Pain Edu? --      Excl. in [place]? --    No data found.  Updated Vital Signs BP 121/84   Pulse (!) 101   Temp 98.1 F (36.7 C)   Resp 20   SpO2 92%   Visual Acuity Right Eye Distance:   Left Eye Distance:   Bilateral Distance:    Right Eye Near:   Left Eye Near:    Bilateral Near:     Physical Exam Vitals and nursing note reviewed.  Constitutional:      General: He is not in acute distress.    Appearance: Normal appearance. He is not ill-appearing.  HENT:     Head: Normocephalic and atraumatic.     Nose: No congestion or rhinorrhea.     Mouth/Throat:     Pharynx: No oropharyngeal exudate or posterior oropharyngeal erythema.  Eyes:     General: Lids are normal. Lids are everted, no foreign bodies appreciated. Vision grossly intact. No scleral icterus.       Right eye: Discharge (clear, watery) present. No foreign body.     Extraocular Movements: Extraocular movements intact.     Right eye: Normal extraocular motion.     Left eye: Normal extraocular motion.     Conjunctiva/sclera: Conjunctivae normal.     Right eye: Right conjunctiva is not injected. No chemosis or exudate.    Pupils: Pupils are equal, round, and reactive to light.     Right eye: No corneal abrasion or fluorescein uptake.     Slit lamp exam:    Right eye: No corneal ulcer or photophobia.  Pulmonary:     Effort: Pulmonary effort is normal. No respiratory distress.  Musculoskeletal:     Cervical back: Normal range of motion. No rigidity.  Lymphadenopathy:     Cervical: No cervical adenopathy.  Skin:    Coloration: Skin is not jaundiced.     Findings: No rash.  Neurological:     General: No focal deficit present.     Mental Status: He is alert and oriented to person, place, and time.     Motor: No weakness.     Gait: Gait normal.  Psychiatric:        Mood and Affect: Mood normal.         Behavior: Behavior normal.      UC Treatments / Results  Labs (all labs ordered are listed, but only abnormal results are displayed) Labs Reviewed -  No data to display  EKG   Radiology No results found.  Procedures Procedures (including critical care time)  Medications Ordered in UC Medications - No data to display  Initial Impression / Assessment and Plan / UC Course  I have reviewed the triage vital signs and the nursing notes.  Pertinent labs & imaging results that were available during my care of the patient were reviewed by me and considered in my medical decision making (see chart for details).     Follow up with eye specialist if pain persists No foreign body noted on exam Eye flushed with saline No abrasion or ulcer noted during exam Final Clinical Impressions(s) / UC Diagnoses   Final diagnoses:  Eye pain, right     Discharge Instructions      Follow up with eye specialist if your symptoms fail to improve, go to ED with worsening symptoms    ED Prescriptions   None    PDMP not reviewed this encounter.   Peri Jefferson, PA-C 06/05/22 [phone]

## 2022-06-05 NOTE — Discharge Instructions (Signed)
Follow up with eye specialist if your symptoms fail to improve, go to ED with worsening symptoms

## 2022-06-05 NOTE — ED Triage Notes (Signed)
Pt reports while getting ready for church he felt like something got in his eye.

## 2022-06-21 ENCOUNTER — Telehealth: Payer: Self-pay | Admitting: Family Medicine

## 2022-06-21 NOTE — Telephone Encounter (Signed)
Contacted Karnell Gubler to schedule their annual wellness visit. Appointment made for 07/01/2022.  Thank you,  [place] Direct dial  [phone]

## 2022-07-01 ENCOUNTER — Ambulatory Visit (INDEPENDENT_AMBULATORY_CARE_PROVIDER_SITE_OTHER): Payer: Medicare HMO

## 2022-07-01 NOTE — Progress Notes (Signed)
 I connected with  Dylan Flynn on 06/13/2022 by a audio enabled telemedicine application and verified that I am speaking with the correct person using two identifiers.  Patient Location: Home  Provider Location: Home Office  I discussed the limitations of evaluation and management by telemedicine. The patient expressed understanding and agreed to proceed.  Subjective:   Dylan Flynn is a 32 y.o. male who presents for an Initial Medicare Annual Wellness Visit.  Review of Systems    Per HPI unless specifically indicated below.  Cardiac Risk Factors include: advanced age (>55men, >65 women);male gender, Hypertension, CAD, and Hyperlipidemia.          Objective:       06/08/2022   10:51 AM 05/20/2022    3:33 PM 05/18/2022    9:53 AM  Vitals with BMI  Height  5' 7"   Weight  143 lbs 143 lbs 14 oz  BMI  22.39 22.53  Systolic 142 122 136  Diastolic 61 68 65  Pulse 67 73 81    There were no vitals filed for this visit. There is no height or weight on file to calculate BMI.     06/08/2022   10:04 AM 05/20/2022    3:35 PM 04/12/2022    1:25 PM 04/06/2022   10:56 AM 03/30/2022   12:39 PM 03/16/2022    9:30 AM 02/23/2022   11:34 AM  Advanced Directives  Does Patient Have a Medical Advance Directive? No No No No No No No  Would patient like information on creating a medical advance directive? No - Patient declined No - Patient declined  No - Patient declined No - Patient declined No - Patient declined No - Patient declined    Current Medications (verified) Outpatient Encounter Medications as of 06/13/2022  Medication Sig   acetaminophen (TYLENOL) 500 MG tablet Take 500 mg by mouth every 6 (six) hours as needed.   amLODipine (NORVASC) 10 MG tablet TAKE 1 TABLET BY MOUTH ONCE DAILY . APPOINTMENT REQUIRED FOR FUTURE REFILLS   aspirin EC 81 MG tablet Take 1 tablet (81 mg total) by mouth daily.   atorvastatin (LIPITOR) 40 MG tablet Take 1 tablet by mouth once daily    Cholecalciferol (VITAMIN D3) 250 MCG (10000 UT) capsule Take 10,000 mcg by mouth daily.   diphenhydrAMINE HCl (BENADRYL ALLERGY PO) Take 25 mg by mouth at bedtime as needed (for sleep).   escitalopram (LEXAPRO) 10 MG tablet Take 1 tablet by mouth once daily   folic acid (FOLVITE) 1 MG tablet Take 1 tablet by mouth once daily   levothyroxine (SYNTHROID) 150 MCG tablet Take 1 tablet (150 mcg total) by mouth daily before breakfast.   lidocaine-prilocaine (EMLA) cream Apply 1 Application topically as needed.   losartan (COZAAR) 100 MG tablet Take 1 tablet by mouth once daily   metoprolol succinate (TOPROL-XL) 50 MG 24 hr tablet TAKE 1 TABLET BY MOUTH ONCE DAILY WITH A MEAL OR  IMMEDIATELY  FOLLOWING   Multiple Vitamins-Minerals (MULTIVITAMIN WITH MINERALS) tablet Take 1 tablet by mouth daily.   nitroGLYCERIN (NITROSTAT) 0.4 MG SL tablet Place 1 tablet (0.4 mg total) under the tongue every 5 (five) minutes as needed. For chest pain (Patient not taking: Reported on 04/21/2021)   prochlorperazine (COMPAZINE) 10 MG tablet Take 1 tablet (10 mg total) by mouth every 6 (six) hours as needed for nausea or vomiting.   Vitamin A 2400 MCG (8000 UT) TABS Take 2,400 mg by mouth daily.   No   facility-administered encounter medications on file as of 06/13/2022.    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 EF 70%; fixed defect involving septum, inferoseptal and inferior wall   Pain, dental 08/02/2016   Poor dental hygiene    Retroperitoneal bleeding    Following cardiac cath   Tobacco abuse    Urine discoloration 03/14/2013   Urine incontinence 12/31/2019   UTI (lower urinary tract infection) 04/11/2013   Past Surgical History:  Procedure Laterality Date   CARDIAC CATHETERIZATION  06/28/2006   IT REVEALS MILD INFERIOR WALL HYPOKINESIS. THE EJECTION FRACTION IS AROUND 50%   COLONOSCOPY     CORONARY ARTERY BYPASS GRAFT  05/02/2006   LIMA to LAD, SVG to DX, SVG to LCX & SVG to OM 1 & 2, and SVG to PD   CORONARY STENT PLACEMENT     Remote past stent to RCA   EYE SURGERY Right    cataracts removed   HEMORRHOID SURGERY  05/03/1987   IR IMAGING GUIDED PORT INSERTION  03/30/2022   UPPER GI ENDOSCOPY     VENTRICULOSTOMY  04/01/2011   Procedure: VENTRICULOSTOMY;  Surgeon: Kyle L Cabbell;  Location: [facility];  Service: Neurosurgery;  Laterality: Right;  Insertion of Ventriculostomy Catheter   VIDEO BRONCHOSCOPY WITH ENDOBRONCHIAL NAVIGATION Left 11/13/2020   Procedure: VIDEO BRONCHOSCOPY WITH ENDOBRONCHIAL NAVIGATION;  Surgeon: Icard, Bradley L, DO;  Location: [facility];  Service: Pulmonary;  Laterality: Left;   VIDEO BRONCHOSCOPY WITH ENDOBRONCHIAL ULTRASOUND Bilateral 11/13/2020   Procedure: VIDEO BRONCHOSCOPY WITH ENDOBRONCHIAL ULTRASOUND;  Surgeon: Icard, Bradley L, DO;  Location: [facility];  Service: Pulmonary;  Laterality: Bilateral;   WISDOM TOOTH EXTRACTION     Family History  Problem Relation Age of Onset   Cancer Mother 69   Diabetes Mother    Alcohol abuse Father     Asthma Father    Diabetes Brother    Lung cancer Brother    Ovarian cancer Maternal Aunt    Liver cancer Paternal Aunt    Heart disease Neg Hx    Social History   Socioeconomic History   Marital status: Married    Spouse name: Not on file   Number of children: Not on file   Years of education: Not on file   Highest education level: Not on file  Occupational History   Not on file    Tobacco Use   Smoking status: Every Day    Packs/day: 0.50    Years: 58.00    Total pack years: 29.00    Types: Cigarettes    Start date: 05/02/1957   Smokeless tobacco: Never   Tobacco comments:    Has smoked 3 ppd, current 1.5-2 ppd  Vaping Use   Vaping Use: Never used  Substance and Sexual Activity   Alcohol use: No   Drug use: No   Sexual activity: Yes    Birth control/protection: Post-menopausal  Other Topics Concern   Not on file  Social History Narrative   High School graduate.  Some college.  Currently retired.  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 worked in [employer] most recently.      Lives with husband and 2 adult daughters and 4 grandchildren.   Social Determinants of Health   Financial Resource Strain: Not on file  Food Insecurity: Not on file  Transportation Needs: Not on file  Physical Activity: Not on file  Stress: Not on file  Social Connections: Not on file    Tobacco Counseling Ready to quit: Not Answered Counseling given: Not Answered Tobacco comments: Has smoked 3 ppd, current 1.5-2 ppd   Clinical Intake:                 Diabetic?No         Activities of Daily Living    03/30/2022   12:38 PM  In your present state of health, do you have any difficulty performing the following activities:  Hearing? 0  Vision? 0  Difficulty concentrating or making decisions? 0  Walking or climbing stairs? 0  Dressing or bathing? 0    Patient Care Team: Eniola, Kehinde T, MD as PCP - General (Family Medicine)  Indicate any recent  Medical Services you may have received from other than [facility] providers in the past year (date may be approximate).     Assessment:   This is a routine wellness examination for Dylan.  Hearing/Vision screen Denies any hearing issues.  Dietary issues and exercise activities discussed:     Goals Addressed   None    Depression Screen    05/20/2022    3:34 PM 04/21/2021    9:54 AM 10/06/2020   11:05 AM 12/31/2019   11:08 AM 04/23/2019    2:47 PM 02/14/2018   11:33 AM 01/10/2018   11:00 AM  PHQ 2/9 Scores  PHQ - 2 Score 4 6 4 3 0 0 0  PHQ- 9 Score 15 14 13 16       Fall Risk    05/20/2022    3:35 PM 04/21/2021    9:54 AM 10/06/2020   11:04 AM 04/23/2019    2:47 PM 02/14/2018   11:33 AM  Fall Risk   Falls in the past year? 0 0 1 0 No  Number falls in past yr: 0 0 0 0   Injury with Fall? 0 0 1 0   Follow up Falls evaluation completed        FALL RISK PREVENTION PERTAINING TO THE HOME:  Any stairs in or around the home? {YES/NO:21197} If so, are there any without handrails? {YES/NO:21197} Home free of loose throw rugs in walkways, pet beds, electrical cords, etc? {YES/NO:21197} Adequate lighting in your home to reduce risk of falls? {YES/NO:21197}  ASSISTIVE DEVICES UTILIZED TO PREVENT FALLS:  Life alert? {YES/NO:21197} Use of a cane, walker or w/c? {YES/NO:21197} Grab bars in the bathroom? {YES/NO:21197} Shower   chair or bench in shower? {YES/NO:21197} Elevated toilet seat or a handicapped toilet? {YES/NO:21197}  TIMED UP AND GO:  Was the test performed? Unable to perform, virtual appointment    Cognitive Function:        Immunizations Immunization History  Administered Date(s) Administered   Influenza,inj,Quad PF,6+ Mos 02/23/2016, 02/14/2018, 04/23/2019, 12/31/2019   PFIZER Comirnaty(Gray Top)Covid-19 Tri-Sucrose Vaccine 10/06/2020   PFIZER(Purple Top)SARS-COV-2 Vaccination 10/10/2019, 10/31/2019   PNEUMOCOCCAL CONJUGATE-20 10/06/2020   Pneumococcal  Conjugate-13 02/23/2016   Pneumococcal Polysaccharide-23 07/20/2017   Tdap 11/30/2011    TDAP status: Due, Education has been provided regarding the importance of this vaccine. Advised may receive this vaccine at local pharmacy or Health Dept. Aware to provide a copy of the vaccination record if obtained from local pharmacy or Health Dept. Verbalized acceptance and understanding.  Flu Vaccine status: Due, Education has been provided regarding the importance of this vaccine. Advised may receive this vaccine at local pharmacy or Health Dept. Aware to provide a copy of the vaccination record if obtained from local pharmacy or Health Dept. Verbalized acceptance and understanding.  Pneumococcal vaccine status: Up to date  Covid-19 vaccine status: Declined, Education has been provided regarding the importance of this vaccine but patient still declined. Advised may receive this vaccine at local pharmacy or Health Dept.or vaccine clinic. Aware to provide a copy of the vaccination record if obtained from local pharmacy or Health Dept. Verbalized acceptance and understanding.  Qualifies for Shingles Vaccine? Yes   Zostavax completed No   Shingrix Completed?: No.    Education has been provided regarding the importance of this vaccine. Patient has been advised to call insurance company to determine out of pocket expense if they have not yet received this vaccine. Advised may also receive vaccine at local pharmacy or Health Dept. Verbalized acceptance and understanding.  Screening Tests Health Maintenance  Topic Date Due   Medicare Annual Wellness (AWV)  Never done   Zoster Vaccines- Shingrix (1 of 2) Never done   DEXA SCAN  Never done   DTaP/Tdap/Td (2 - Td or Tdap) 11/29/2021   COVID-19 Vaccine (4 - 2023-24 season) 12/31/2021   INFLUENZA VACCINE  01/01/2023 (Originally 11/30/2021)   Fecal DNA (Cologuard)  01/27/2023   Pneumonia Vaccine 65+ Years old  Completed   Hepatitis C Screening  Completed   HPV  VACCINES  Aged Out    Health Maintenance  Health Maintenance Due  Topic Date Due   Medicare Annual Wellness (AWV)  Never done   Zoster Vaccines- Shingrix (1 of 2) Never done   DEXA SCAN  Never done   DTaP/Tdap/Td (2 - Td or Tdap) 11/29/2021   COVID-19 Vaccine (4 - 2023-24 season) 12/31/2021    Colorectal cancer screening: Type of screening: Cologuard. Completed 01/27/2020. Repeat every 3 years  {Mammogram status:21018020}  DEXA Scan: Overdue  Lung Cancer Screening: (Low Dose CT Chest recommended if Age 55-80 years, 30 pack-year currently smoking OR have quit w/in 15years.) {DOES NOT does:27190::"does not"} qualify.   Lung Cancer Screening Referral: ***  Additional Screening:  Hepatitis C Screening: does qualify; Completed 02/23/2016  Vision Screening: Recommended annual ophthalmology exams for early detection of glaucoma and other disorders of the eye. Is the patient up to date with their annual eye exam?  {YES/NO:21197} Who is the provider or what is the name of the office in which the patient attends annual eye exams? *** If pt is not established with a provider, would they like to be referred to a provider to   establish care? {YES/NO:21197}.   Dental Screening: Recommended annual dental exams for proper oral hygiene  Community Resource Referral / Chronic Care Management: CRR required this visit?  No   CCM required this visit?  No      Plan:     I have personally reviewed and noted the following in the patient's chart:   Medical and social history Use of alcohol, tobacco or illicit drugs  Current medications and supplements including opioid prescriptions. Patient is not currently taking opioid prescriptions. Functional ability and status Nutritional status Physical activity Advanced directives List of other physicians Hospitalizations, surgeries, and ER visits in previous 12 months Vitals Screenings to include cognitive, depression, and falls Referrals and  appointments  In addition, I have reviewed and discussed with patient certain preventive protocols, quality metrics, and best practice recommendations. A written personalized care plan for preventive services as well as general preventive health recommendations were provided to patient.     Arrabella Westerman L Gilbert Narain, CMA   06/13/2022  Nurse Notes: Approximately 30 minute Non-Face -To-Face Medicare Wellness Visit        

## 2022-07-01 NOTE — Patient Instructions (Signed)
Health Maintenance, Male Adopting a healthy lifestyle and getting preventive care are important in promoting health and wellness. Ask your health care provider about: The right schedule for you to have regular tests and exams. Things you can do on your own to prevent diseases and keep yourself healthy. What should I know about diet, weight, and exercise? Eat a healthy diet  Eat a diet that includes plenty of vegetables, fruits, low-fat dairy products, and lean protein. Do not eat a lot of foods that are high in solid fats, added sugars, or sodium. Maintain a healthy weight Body mass index (BMI) is a measurement that can be used to identify possible weight problems. It estimates body fat based on height and weight. Your health care provider can help determine your BMI and help you achieve or maintain a healthy weight. Get regular exercise Get regular exercise. This is one of the most important things you can do for your health. Most adults should: Exercise for at least 150 minutes each week. The exercise should increase your heart rate and make you sweat (moderate-intensity exercise). Do strengthening exercises at least twice a week. This is in addition to the moderate-intensity exercise. Spend less time sitting. Even light physical activity can be beneficial. Watch cholesterol and blood lipids Have your blood tested for lipids and cholesterol at 32 years of age, then have this test every 5 years. You may need to have your cholesterol levels checked more often if: Your lipid or cholesterol levels are high. You are older than 32 years of age. You are at high risk for heart disease. What should I know about cancer screening? Many types of cancers can be detected early and may often be prevented. Depending on your health history and family history, you may need to have cancer screening at various ages. This may include screening for: Colorectal cancer. Prostate cancer. Skin cancer. Lung  cancer. What should I know about heart disease, diabetes, and high blood pressure? Blood pressure and heart disease High blood pressure causes heart disease and increases the risk of stroke. This is more likely to develop in people who have high blood pressure readings or are overweight. Talk with your health care provider about your target blood pressure readings. Have your blood pressure checked: Every 3-5 years if you are 18-39 years of age. Every year if you are 40 years old or older. If you are between the ages of 65 and 75 and are a current or former smoker, ask your health care provider if you should have a one-time screening for abdominal aortic aneurysm (AAA). Diabetes Have regular diabetes screenings. This checks your fasting blood sugar level. Have the screening done: Once every three years after age 45 if you are at a normal weight and have a low risk for diabetes. More often and at a younger age if you are overweight or have a high risk for diabetes. What should I know about preventing infection? Hepatitis B If you have a higher risk for hepatitis B, you should be screened for this virus. Talk with your health care provider to find out if you are at risk for hepatitis B infection. Hepatitis C Blood testing is recommended for: Everyone born from 1945 through 1965. Anyone with known risk factors for hepatitis C. Sexually transmitted infections (STIs) You should be screened each year for STIs, including gonorrhea and chlamydia, if: You are sexually active and are younger than 32 years of age. You are older than 32 years of age and your   health care provider tells you that you are at risk for this type of infection. Your sexual activity has changed since you were last screened, and you are at increased risk for chlamydia or gonorrhea. Ask your health care provider if you are at risk. Ask your health care provider about whether you are at high risk for HIV. Your health care provider  may recommend a prescription medicine to help prevent HIV infection. If you choose to take medicine to prevent HIV, you should first get tested for HIV. You should then be tested every 3 months for as long as you are taking the medicine. Follow these instructions at home: Alcohol use Do not drink alcohol if your health care provider tells you not to drink. If you drink alcohol: Limit how much you have to 0-2 drinks a day. Know how much alcohol is in your drink. In the [place] one drink equals one 12 oz bottle of beer (355 mL), one 5 oz glass of wine (148 mL), or one 1 oz glass of hard liquor (44 mL). Lifestyle Do not use any products that contain nicotine or tobacco. These products include cigarettes, chewing tobacco, and vaping devices, such as e-cigarettes. If you need help quitting, ask your health care provider. Do not use street drugs. Do not share needles. Ask your health care provider for help if you need support or information about quitting drugs. General instructions Schedule regular health, dental, and eye exams. Stay current with your vaccines. Tell your health care provider if: You often feel depressed. You have ever been abused or do not feel safe at home. Summary Adopting a healthy lifestyle and getting preventive care are important in promoting health and wellness. Follow your health care provider's instructions about healthy diet, exercising, and getting tested or screened for diseases. Follow your health care provider's instructions on monitoring your cholesterol and blood pressure. This information is not intended to replace advice given to you by your health care provider. Make sure you discuss any questions you have with your health care provider. Document Revised: 09/07/2020 Document Reviewed: 09/07/2020 Elsevier Patient Education  2023 [employer] Inc[employer]  

## 2022-09-16 ENCOUNTER — Ambulatory Visit (INDEPENDENT_AMBULATORY_CARE_PROVIDER_SITE_OTHER): Payer: Medicare HMO | Admitting: Family Medicine

## 2022-09-16 VITALS — BP 120/84 | HR 102 | Ht 68.0 in | Wt 340.4 lb

## 2022-09-16 DIAGNOSIS — B309 Viral conjunctivitis, unspecified: Secondary | ICD-10-CM

## 2022-09-16 MED ORDER — CARBOXYMETHYLCELLULOSE SODIUM 0.5 % OP SOLN: 1.0000 [drp] | Freq: Three times a day (TID) | OPHTHALMIC | 3 refills | Status: AC | PRN

## 2022-09-16 NOTE — Progress Notes (Unsigned)
    SUBJECTIVE:   CHIEF COMPLAINT / HPI:   Eye complaint Dylan Flynn is a pleasant 32 yo man here with a couple day duration of left eye redness, pain, and burning sensation. Some crusted discharge in the morning, but specifies that he does not have to continuously wipe away discharge throughout the day.   Able to see normally, denies blurriness or field deficits. Denies foreign body sensation.  No occupational or home exposures that would lend to foreign body in eye.  Unilateral involvement.  No current or prodromal nasal congestion, rhinorrhea, ear complaints, cough, sneezing, nausea, or vomiting.  No occupational or personal routine exposures to children.   PERTINENT  PMH / PSH:  Patient Active Problem List   Diagnosis Date Noted   Viral conjunctivitis 09/18/2022   Pilonidal cyst without abscess 09/08/2021   Intertrigo 02/04/2019   Asthma    Schizophrenia (HCC)     OBJECTIVE:   BP 120/84   Pulse (!) 102   Ht 5\' 8"  (1.727 m)   Wt (!) 340 lb 6.4 oz (154.4 kg)   SpO2 99%   BMI 51.76 kg/m    Gen: awake, alert, NAD HEENT: right eye unremarkable, left sclera injected, left eye lid without tenderness or swelling, PEARRL, EOM intact without pain, TM pink and flat bilaterally, nares unremarkable, moist oral mucosa, posterior oropharynx without erythema or lesions Neck: no palpable lymphadenopathy   ASSESSMENT/PLAN:   Viral conjunctivitis HPI and exam most consistent with viral conjunctivitis.  Discussed conservative precautions, including warm compresses and frequent handwashing.  Rx lubricating eyedrops to help with dry, burning sensation.  Return precautions given, see AVS for more.     Fayette Pho, MD [facility]

## 2022-09-16 NOTE — Patient Instructions (Addendum)
It was wonderful to see you today. Thank you for allowing me to be a part of your care. Below is a short summary of what we discussed at your visit today:  Viral conjunctivitis This looks viral in nature.  Do lubricating drops to help with the dry itchy feeling.   Reasons to come back for care: - copious goopy eye discharge - redness around eye  - difficulty seeing - painful vision  COVID booster You are eligible for this. You may obtain this in clinic or your pharmacy at your convenience.   Medicare Annual Wellness Exam Your chart indicates that you are due for your Medicare annual wellness exam.  Your insurance likes [place] to do one of these every year.  This is a nurse only visit that takes about 30 minutes to an hour.  It can be done either in person or virtually.  This is an in-depth visit that focuses on preventative care and keeping you healthy.  Somebody from our clinic will be calling you soon to get this scheduled.  Please bring all of your medications to every appointment!  If you have any questions or concerns, please do not hesitate to contact us via phone or MyChart message.   Fayette Pho, MD

## 2022-09-18 ENCOUNTER — Encounter: Payer: Self-pay | Admitting: Family Medicine

## 2022-09-18 DIAGNOSIS — B309 Viral conjunctivitis, unspecified: Secondary | ICD-10-CM | POA: Insufficient documentation

## 2022-09-18 NOTE — Assessment & Plan Note (Signed)
HPI and exam most consistent with viral conjunctivitis.  Discussed conservative precautions, including warm compresses and frequent handwashing.  Rx lubricating eyedrops to help with dry, burning sensation.  Return precautions given, see AVS for more.

## 2022-09-19 ENCOUNTER — Telehealth: Payer: Self-pay | Admitting: Family Medicine

## 2022-09-19 NOTE — Telephone Encounter (Signed)
Contacted Dylan Flynn to schedule their annual wellness visit. Appointment made for 10/03/2022 at 2:30PM.  *Dylan Flynn*

## 2022-09-28 ENCOUNTER — Encounter: Payer: Self-pay | Admitting: Family Medicine

## 2022-09-28 ENCOUNTER — Ambulatory Visit (INDEPENDENT_AMBULATORY_CARE_PROVIDER_SITE_OTHER): Payer: Medicare HMO | Admitting: Family Medicine

## 2022-09-28 VITALS — BP 126/70 | HR 74 | Ht 68.0 in | Wt 336.0 lb

## 2022-09-28 DIAGNOSIS — B309 Viral conjunctivitis, unspecified: Secondary | ICD-10-CM

## 2022-09-28 NOTE — Patient Instructions (Signed)
It was wonderful to see you today. Thank you for allowing me to be a part of your care. Below is a short summary of what we discussed at your visit today:  Eye issue Your physical exam today is quite reassuring. I believe you are out of the woods with this conjunctivitis and should continue to improve from here on out. No antibiotics needed at this time. Continue to use your eyedrops as often as needed to help lubricate the eye and prevent that burning sensation. Like we talked about, take a break from any screen time at least once an hour for about 5 minutes or so.  Make sure to outside and take a look far away, to make sure to rebalance your eyes with long distance vision and also the full spectrum of light.  Medicare Annual Wellness Exam Your chart indicates that you are due for your Medicare annual wellness exam.  Your insurance likes [place] to do one of these every year.  This is a nurse only visit that takes about 30 minutes to an hour.  It can be done either in person or virtually.    See next page for your appointment details.  Please bring all of your medications to every appointment! If you have any questions or concerns, please do not hesitate to contact us via phone or MyChart message.   Fayette Pho, MD

## 2022-09-28 NOTE — Progress Notes (Signed)
   SUBJECTIVE:   CHIEF COMPLAINT / HPI:   Conjunctivitis follow-up Patient is here for a follow-up of his conjunctivitis.  Last in clinic 5/17, diagnosed with viral conjunctivitis.  At that time, recommended conservative care without any antibiotics.  Rx lubricating eyedrops.  Today, he reports improvement of the conjunctivitis.  Denies pain, vision changes, and development of illnesses.  Denies nasal congestion, sneezing, coughing, postnasal drip, nausea, vomiting, and diarrhea.  Eating and drinking normally.  Does have intermittent burning sensation of the eye, says that maybe something feels like it is in there every so often.  However, this is not persistent and goes away on its own.  PERTINENT  PMH / PSH:  Patient Active Problem List   Diagnosis Date Noted   Viral conjunctivitis 09/18/2022   Asthma    Schizophrenia (HCC)     OBJECTIVE:   BP 126/70   Pulse 74   Ht 5\' 8"  (1.727 m)   Wt (!) 336 lb (152.4 kg)   SpO2 98%   BMI 51.09 kg/m    General: Awake, alert, NAD HEENT: Sclera anicteric and noninjected, PERRL, EOM intact, bilateral TM pink and flat, bilateral canals unremarkable, nares unremarkable, moist oral mucosa without lesion Neck: No palpable lymphadenopathy Psych: Pleasant and appropriate  ASSESSMENT/PLAN:   Viral conjunctivitis Appears to be resolved.  No indication for antibiotics at this time.  Discussed screen time precautions to prevent eye discomfort.  Patient to continue lubricating eyedrops as needed. Return precautions discussed, see AVS for more.      Fayette Pho, MD [facility]

## 2022-09-28 NOTE — Assessment & Plan Note (Signed)
Appears to be resolved.  No indication for antibiotics at this time.  Discussed screen time precautions to prevent eye discomfort.  Patient to continue lubricating eyedrops as needed. Return precautions discussed, see AVS for more.

## 2022-10-03 ENCOUNTER — Ambulatory Visit (INDEPENDENT_AMBULATORY_CARE_PROVIDER_SITE_OTHER): Payer: Medicare HMO

## 2022-10-03 VITALS — Ht 68.0 in | Wt 336.0 lb

## 2022-10-03 DIAGNOSIS — Z0000 Encounter for general adult medical examination without abnormal findings: Secondary | ICD-10-CM

## 2022-10-03 NOTE — Patient Instructions (Addendum)
Dylan Flynn , Thank you for taking time to come for your Medicare Wellness Visit. I appreciate your ongoing commitment to your health goals. Please review the following plan we discussed and let me know if I can assist you in the future.   These are the goals we discussed:  Goals      Remain active        This is a list of the screening recommended for you and due dates:  Health Maintenance  Topic Date Due   COVID-19 Vaccine (4 - 2023-24 season) 12/31/2021   Flu Shot  12/01/2022   Medicare Annual Wellness Visit  10/03/2023   DTaP/Tdap/Td vaccine (2 - Td or Tdap) 06/09/2028   Hepatitis C Screening  Completed   HIV Screening  Completed   HPV Vaccine  Aged Out    Advanced directives: Information on Advanced Care Planning can be found at [facility] Advance Health Care Directives Advance Health Care Directives ([email or web address]  Please bring a copy of your health care power of attorney and living will to the office to be added to your chart at your convenience.   Conditions/risks identified: Aim for 30 minutes of exercise or brisk walking, 6-8 glasses of water, and 5 servings of fruits and vegetables each day.   Next appointment: Follow up in one year for your annual wellness visit   Preventive Care 65-47 Years Old, Male Preventive care refers to lifestyle choices and visits with your health care provider that can promote health and wellness. Preventive care visits are also called wellness exams. What can I expect for my preventive care visit? Counseling During your preventive care visit, your health care provider may ask about your: Medical history, including: Past medical problems. Family medical history. Current health, including: Emotional well-being. Home life and relationship well-being. Sexual activity. Lifestyle, including: Alcohol, nicotine or tobacco, and drug use. Access to firearms. Diet, exercise, and sleep habits. Safety issues such as seatbelt  and bike helmet use. Sunscreen use. Work and work Astronomer. Physical exam Your health care provider may check your: Height and weight. These may be used to calculate your BMI (body mass index). BMI is a measurement that tells if you are at a healthy weight. Waist circumference. This measures the distance around your waistline. This measurement also tells if you are at a healthy weight and may help predict your risk of certain diseases, such as type 2 diabetes and high blood pressure. Heart rate and blood pressure. Body temperature. Skin for abnormal spots. What immunizations do I need? Vaccines are usually given at various ages, according to a schedule. Your health care provider will recommend vaccines for you based on your age, medical history, and lifestyle or other factors, such as travel or where you work. What tests do I need? Screening Your health care provider may recommend screening tests for certain conditions. This may include: Lipid and cholesterol levels. Diabetes screening. This is done by checking your blood sugar (glucose) after you have not eaten for a while (fasting). Hepatitis B test. Hepatitis C test. HIV (human immunodeficiency virus) test. STI (sexually transmitted infection) testing, if you are at risk. Talk with your health care provider about your test results, treatment options, and if necessary, the need for more tests. Follow these instructions at home: Eating and drinking  Eat a healthy diet that includes fresh fruits and vegetables, whole grains, lean protein, and low-fat dairy products. Drink enough fluid to keep your urine pale yellow. Take vitamin and mineral  supplements as recommended by your health care provider. Do not drink alcohol if your health care provider tells you not to drink. If you drink alcohol: Limit how much you have to 0-2 drinks a day. Know how much alcohol is in your drink. In the [place] one drink equals one 12 oz bottle of beer (355  mL), one 5 oz glass of wine (148 mL), or one 1 oz glass of hard liquor (44 mL). Lifestyle Brush your teeth every morning and night with fluoride toothpaste. Floss one time each day. Exercise for at least 30 minutes 5 or more days each week. Do not use any products that contain nicotine or tobacco. These products include cigarettes, chewing tobacco, and vaping devices, such as e-cigarettes. If you need help quitting, ask your health care provider. Do not use drugs. If you are sexually active, practice safe sex. Use a condom or other form of protection to prevent STIs. Find healthy ways to manage stress, such as: Meditation, yoga, or listening to music. Journaling. Talking to a trusted person. Spending time with friends and family. Minimize exposure to UV radiation to reduce your risk of skin cancer. Safety Always wear your seat belt while driving or riding in a vehicle. Do not drive: If you have been drinking alcohol. Do not ride with someone who has been drinking. If you have been using any mind-altering substances or drugs. While texting. When you are tired or distracted. Wear a helmet and other protective equipment during sports activities. If you have firearms in your house, make sure you follow all gun safety procedures. Seek help if you have been physically or sexually abused. What's next? Go to your health care provider once a year for an annual wellness visit. Ask your health care provider how often you should have your eyes and teeth checked. Stay up to date on all vaccines. This information is not intended to replace advice given to you by your health care provider. Make sure you discuss any questions you have with your health care provider. Document Revised: 10/14/2020 Document Reviewed: 10/14/2020 Elsevier Patient Education  2022 [employer].

## 2022-10-03 NOTE — Progress Notes (Signed)
Subjective:   Dylan Flynn is a 32 y.o. male who presents for an Initial Medicare Annual Wellness Visit.  I connected with  Sherley Barringer on 10/03/22 by a audio enabled telemedicine application and verified that I am speaking with the correct person using two identifiers.  Patient Location: Home  Provider Location: Home Office  I discussed the limitations of evaluation and management by telemedicine. The patient expressed understanding and agreed to proceed.  Review of Systems     Cardiac Risk Factors include: male gender;sedentary lifestyle;obesity (BMI >30kg/m2)     Objective:    Today's Vitals   10/03/22 1429  Weight: (!) 336 lb (152.4 kg)  Height: 5\' 8"  (1.727 m)   Body mass index is 51.09 kg/m.     10/03/2022    2:39 PM 09/28/2022   10:53 AM 09/16/2022    3:53 PM 09/15/2021    3:27 PM 09/07/2021    8:54 AM 12/06/2019   11:48 PM 11/22/2019    8:05 AM  Advanced Directives  Does Patient Have a Medical Advance Directive? No No No No No  No  Would patient like information on creating a medical advance directive? Yes (MAU/Ambulatory/Procedural Areas - Information given) No - Patient declined No - Patient declined No - Patient declined No - Patient declined  No - Patient declined     Information is confidential and restricted. Go to Review Flowsheets to unlock data.    Current Medications (verified) Outpatient Encounter Medications as of 10/03/2022  Medication Sig   albuterol (VENTOLIN HFA) 108 (90 Base) MCG/ACT inhaler Inhale 1-2 puffs into the lungs every 6 (six) hours as needed for wheezing or shortness of breath.   carboxymethylcellulose (LUBRICANT EYE DROPS) 0.5 % SOLN Place 1 drop into the left eye 3 (three) times daily as needed.   cloZAPine (CLOZARIL) 100 MG tablet Take 1.5 tablets (150 mg total) by mouth 2 (two) times daily.   diclofenac Sodium (VOLTAREN) 1 % GEL Apply 2 g topically 4 (four) times daily.   lithium carbonate (ESKALITH) 450 MG CR tablet Take 900 mg by  mouth at bedtime.   loperamide (IMODIUM) 2 MG capsule TAKE 1 CAPSULE BY MOUTH AS NEEDED FOR FOR DIARRHEA OR LOOSE STOOLS.   valproic acid (DEPAKENE) 250 MG capsule Take 2 capsules (500 mg total) by mouth 2 (two) times daily.   VIIBRYD 40 MG TABS Take 40 mg by mouth daily.   OLANZapine (ZYPREXA) 5 MG tablet Take 1 tablet (5 mg total) by mouth daily as needed (hallucinations).   No facility-administered encounter medications on file as of 10/03/2022.    Allergies (verified) Other and Shellfish allergy   History: Past Medical History:  Diagnosis Date   Asthma    Diarrhea due to drug    Healthcare maintenance 02/04/2019   Intertrigo 02/04/2019   Localized swelling, mass and lump, neck 08/26/2019   Pilonidal cyst without abscess 09/08/2021   Schizophrenia (HCC)    Seizures (HCC)    Suicidal ideation    No past surgical history on file. Family History  Problem Relation Age of Onset   Diabetes Mother    Dementia Maternal Grandfather    Social History   Socioeconomic History   Marital status: Single    Spouse name: Not on file   Number of children: Not on file   Years of education: Not on file   Highest education level: Not on file  Occupational History   Not on file  Tobacco Use   Smoking status: Never  Smokeless tobacco: Never  Vaping Use   Vaping Use: Never used  Substance and Sexual Activity   Alcohol use: Never   Drug use: Never   Sexual activity: Never  Other Topics Concern   Not on file  Social History Narrative   Not on file   Social Determinants of Health   Financial Resource Strain: Low Risk  (10/03/2022)   Overall Financial Resource Strain (CARDIA)    Difficulty of Paying Living Expenses: Not hard at all  Food Insecurity: No Food Insecurity (10/03/2022)   Hunger Vital Sign    Worried About Running Out of Food in the Last Year: Never true    Ran Out of Food in the Last Year: Never true  Transportation Needs: No Transportation Needs (10/03/2022)   PRAPARE -  Administrator, Civil Service (Medical): No    Lack of Transportation (Non-Medical): No  Physical Activity: Inactive (10/03/2022)   Exercise Vital Sign    Days of Exercise per Week: 0 days    Minutes of Exercise per Session: 0 min  Stress: No Stress Concern Present (10/03/2022)   [employer] of Occupational Health - Occupational Stress Questionnaire    Feeling of Stress : Not at all  Social Connections: Moderately Isolated (10/03/2022)   Social Connection and Isolation Panel [NHANES]    Frequency of Communication with Friends and Family: More than three times a week    Frequency of Social Gatherings with Friends and Family: Three times a week    Attends Religious Services: 1 to 4 times per year    Active Member of Clubs or Organizations: No    Attends Banker Meetings: Never    Marital Status: Never married    Tobacco Counseling Counseling given: Not Answered   Clinical Intake:  Pre-visit preparation completed: Yes  Pain : No/denies pain  Diabetes: No  How often do you need to have someone help you when you read instructions, pamphlets, or other written materials from your doctor or pharmacy?: 1 - Never  Diabetic?No   Interpreter Needed?: No  Information entered by :: Kandis Fantasia LPN   Activities of Daily Living    10/03/2022    2:39 PM  In your present state of health, do you have any difficulty performing the following activities:  Hearing? 0  Vision? 0  Difficulty concentrating or making decisions? 0  Walking or climbing stairs? 0  Dressing or bathing? 0  Doing errands, shopping? 0  Preparing Food and eating ? N  Using the Toilet? N  In the past six months, have you accidently leaked urine? N  Do you have problems with loss of bowel control? N  Managing your Medications? N  Managing your Finances? N  Housekeeping or managing your Housekeeping? N    Patient Care Team: Fayette Pho, MD as PCP - General (Family  Medicine) Arlana Hove, NP as Nurse Practitioner (Psychiatry)  Indicate any recent Medical Services you may have received from other than [facility] providers in the past year (date may be approximate).     Assessment:   This is a routine wellness examination for Dylan Flynn.  Hearing/Vision screen Hearing Screening - Comments:: Denies hearing difficulties   Vision Screening - Comments:: Wears rx glasses - up to date with routine eye exams    Dietary issues and exercise activities discussed: Current Exercise Habits: The patient does not participate in regular exercise at present   Goals Addressed  This Visit's Progress    Remain active         Depression Screen    10/03/2022    2:33 PM 09/28/2022   10:53 AM 09/16/2022    3:54 PM 09/15/2021    3:28 PM 09/07/2021    8:55 AM 07/20/2020    8:43 AM 11/18/2019    9:27 AM  PHQ 2/9 Scores  PHQ - 2 Score 0 0 0 0 0 0 0  PHQ- 9 Score 0 0 0 0 0 3 0    Fall Risk    10/03/2022    2:31 PM 10/25/2019    9:03 AM 12/12/2018    3:18 PM 11/06/2018    8:37 AM  Fall Risk   Falls in the past year? 0 0 0 0  Number falls in past yr: 0 0 0 0  Injury with Fall? 0 0    Risk for fall due to : No Fall Risks     Follow up Falls prevention discussed;Education provided;Falls evaluation completed  Falls evaluation completed Falls evaluation completed    FALL RISK PREVENTION PERTAINING TO THE HOME:  Any stairs in or around the home? No  If so, are there any without handrails? No  Home free of loose throw rugs in walkways, pet beds, electrical cords, etc? Yes  Adequate lighting in your home to reduce risk of falls? Yes   ASSISTIVE DEVICES UTILIZED TO PREVENT FALLS:  Life alert? No  Use of a cane, walker or w/c? No  Grab bars in the bathroom? No  Shower chair or bench in shower? No  Elevated toilet seat or a handicapped toilet? No   TIMED UP AND GO:  Was the test performed? No . Telephonic visit   Cognitive Function:        10/03/2022     2:40 PM  6CIT Screen  What Year? 0 points  What month? 0 points  What time? 0 points  Count back from 20 0 points  Months in reverse 0 points  Repeat phrase 0 points  Total Score 0 points    Immunizations Immunization History  Administered Date(s) Administered   Influenza,inj,Quad PF,6+ Mos 02/04/2019, 04/30/2020   PFIZER(Purple Top)SARS-COV-2 Vaccination 07/19/2019, 08/13/2019, 04/22/2020   Tdap 06/09/2018    TDAP status: Up to date  Pneumococcal vaccine status: Up to date  Covid-19 vaccine status: Information provided on how to obtain vaccines.   Qualifies for Shingles Vaccine? No    Screening Tests Health Maintenance  Topic Date Due   COVID-19 Vaccine (4 - 2023-24 season) 12/31/2021   INFLUENZA VACCINE  12/01/2022   Medicare Annual Wellness (AWV)  10/03/2023   DTaP/Tdap/Td (2 - Td or Tdap) 06/09/2028   Hepatitis C Screening  Completed   HIV Screening  Completed   HPV VACCINES  Aged Out    Health Maintenance  Health Maintenance Due  Topic Date Due   COVID-19 Vaccine (4 - 2023-24 season) 12/31/2021    Lung Cancer Screening: (Low Dose CT Chest recommended if Age 49-80 years, 30 pack-year currently smoking OR have quit w/in 15years.) does not qualify.   Lung Cancer Screening Referral: n/a  Additional Screening:  Hepatitis C Screening: does qualify; Completed 07/20/20  Vision Screening: Recommended annual ophthalmology exams for early detection of glaucoma and other disorders of the eye. Is the patient up to date with their annual eye exam?  Yes  Who is the provider or what is the name of the office in which the patient attends annual eye exams?  Unable to provide name  If pt is not established with a provider, would they like to be referred to a provider to establish care? No .   Dental Screening: Recommended annual dental exams for proper oral hygiene  Community Resource Referral / Chronic Care Management: CRR required this visit?  No   CCM required this  visit?  No      Plan:     I have personally reviewed and noted the following in the patient's chart:   Medical and social history Use of alcohol, tobacco or illicit drugs  Current medications and supplements including opioid prescriptions. Patient is not currently taking opioid prescriptions. Functional ability and status Nutritional status Physical activity Advanced directives List of other physicians Hospitalizations, surgeries, and ER visits in previous 12 months Vitals Screenings to include cognitive, depression, and falls Referrals and appointments  In addition, I have reviewed and discussed with patient certain preventive protocols, quality metrics, and best practice recommendations. A written personalized care plan for preventive services as well as general preventive health recommendations were provided to patient.     Durwin Nora, [place]   05/07/1094   Due to this being a virtual visit, the after visit summary with patients personalized plan was offered to patient via mail or my-chart. Patient would like to access on my-chart  Nurse Notes: No concerns

## 2023-05-29 ENCOUNTER — Ambulatory Visit (INDEPENDENT_AMBULATORY_CARE_PROVIDER_SITE_OTHER): Payer: Medicare HMO | Admitting: Student

## 2023-05-29 ENCOUNTER — Encounter: Payer: Self-pay | Admitting: Student

## 2023-05-29 VITALS — BP 98/67 | HR 108 | Ht 68.0 in | Wt 357.2 lb

## 2023-05-29 DIAGNOSIS — R7309 Other abnormal glucose: Secondary | ICD-10-CM

## 2023-05-29 DIAGNOSIS — L02215 Cutaneous abscess of perineum: Secondary | ICD-10-CM | POA: Insufficient documentation

## 2023-05-29 DIAGNOSIS — Z23 Encounter for immunization: Secondary | ICD-10-CM | POA: Diagnosis not present

## 2023-05-29 LAB — POCT GLYCOSYLATED HEMOGLOBIN (HGB A1C): Hemoglobin A1C: 5.6 % (ref 4.0–5.6)

## 2023-05-29 MED ORDER — AMOXICILLIN-POT CLAVULANATE 875-125 MG PO TABS: 1.0000 | ORAL_TABLET | Freq: Two times a day (BID) | ORAL | 0 refills | Status: AC

## 2023-05-29 NOTE — Progress Notes (Signed)
    SUBJECTIVE:   CHIEF COMPLAINT / HPI:   Dylan Flynn is a 33 year old male here with his grandmother to discuss abscess in groin, noticed about 1 week ago. Started draining 2 day sago, purulence and blood. No fever or chills. Has not used any OTC, not using any topical ointments or creams.  Has had cyst in the past, notably pilonidal cyst requiring antimicrobial treatment.  PERTINENT  PMH / PSH:  Obesity-BMI 54.31 Schizophrenia, previously managed by [facility]  OBJECTIVE:   BP 98/67   Pulse (!) 108   Ht 5\' 8"  (1.727 m)   Wt (!) 357 lb 3.2 oz (162 kg)   SpO2 100%   BMI 54.31 kg/m   General: No distress, in good spirits, generally well appearing Respiratory: Normal effort on room air Skin: Perineum with swelling. Multiple (3-4) areas of induration in various stages of healing, and abscess about 1-2 cm in size. Scant purulent drainage.   ASSESSMENT/PLAN:   Perineal abscess Present for about 1 week, with purulent drainage. Treat with Augmentin 875-125 mg twice daily for 10 days Warm compresses, 3-4 times daily as well as sitz bath's recommended General Surgery consult placed given recurrence of abscesses Return precautions discussed, ED precautions for fever, chills, worsening of abscess or perineal swelling  Elevated hemoglobin A1c A1c 5.6% today. Discussed lifestyle changes, repeat in 6 months     Darral Dash, DO [facility]

## 2023-05-29 NOTE — Patient Instructions (Addendum)
It was great seeing you today.  As we discussed, -I sent in antibiotics to your pharmacy.  You should take this twice a day for 10 days. -Use warm compresses 3-4 times a day for 10 to 15 minutes at a time -If you develop fever, chills with increasing size of abscess, then you should seek urgent care -Because of recurrence of abscesses, I have sent a referral to general surgery   If you have any questions or concerns, please feel free to call the clinic.   Have a wonderful day,  Dr. Darral Dash [facility] [phone]

## 2023-05-29 NOTE — Assessment & Plan Note (Signed)
A1c 5.6% today. Discussed lifestyle changes, repeat in 6 months

## 2023-05-29 NOTE — Assessment & Plan Note (Addendum)
Present for about 1 week, with purulent drainage. Treat with Augmentin 875-125 mg twice daily for 10 days Warm compresses, 3-4 times daily as well as sitz bath's recommended General Surgery consult placed given recurrence of abscesses Return precautions discussed, ED precautions for fever, chills, worsening of abscess or perineal swelling

## 2023-08-16 ENCOUNTER — Ambulatory Visit (HOSPITAL_COMMUNITY): Admission: EM | Admit: 2023-08-16 | Discharge: 2023-08-16 | Disposition: A | Discharge status: Home / Self Care

## 2023-08-16 ENCOUNTER — Other Ambulatory Visit: Payer: Self-pay

## 2023-08-16 ENCOUNTER — Encounter (HOSPITAL_COMMUNITY): Payer: Self-pay | Admitting: Emergency Medicine

## 2023-08-16 ENCOUNTER — Ambulatory Visit (INDEPENDENT_AMBULATORY_CARE_PROVIDER_SITE_OTHER)

## 2023-08-16 DIAGNOSIS — R051 Acute cough: Secondary | ICD-10-CM | POA: Diagnosis not present

## 2023-08-16 DIAGNOSIS — J209 Acute bronchitis, unspecified: Secondary | ICD-10-CM | POA: Diagnosis not present

## 2023-08-16 MED ORDER — BENZONATATE 200 MG PO CAPS: 200.0000 mg | ORAL_CAPSULE | Freq: Three times a day (TID) | ORAL | 0 refills | Status: AC

## 2023-08-16 MED ORDER — DEXAMETHASONE SODIUM PHOSPHATE 10 MG/ML IJ SOLN
10.0000 mg | Freq: Once | INTRAMUSCULAR | Status: AC
  Administered 2023-08-16: 10 mg via INTRAMUSCULAR

## 2023-08-16 MED ORDER — DEXAMETHASONE SODIUM PHOSPHATE 10 MG/ML IJ SOLN
INTRAMUSCULAR | Status: AC
  Filled 2023-08-16: qty 1

## 2023-08-16 NOTE — ED Triage Notes (Signed)
 Cough and congestion for a month.  Non-productive cough currently.  Initially, phlegm was yellow, no phlegm currently.  Denies fever, denies runny nose.    Initially took mucinex, not taking any medicines for symptoms now

## 2023-08-16 NOTE — Discharge Instructions (Signed)
 I do not see any pneumonia on your chest x-ray, the official radiology read will be back in the next few hours and you will be contacted if we need to update your treatment plan.  Steroid injection will help reduce inflammation.  You can take the Tessalon Perles every 8 hours as needed for cough.  Mucinex 1200 mg daily can help loosen up any secretions.  Same with the humidifier may help as well.  Symptoms should improve over the next few weeks, if you develop any new concerning symptoms follow-up with your primary care provider or return to clinic.

## 2023-08-16 NOTE — ED Provider Notes (Signed)
 [facility]    CSN: [id number] Arrival date & time: 08/16/23  5956      History   Chief Complaint Chief Complaint  Patient presents with   Cough    HPI Dylan Flynn is a 33 y.o. male.   Patient presents to clinic over concerns of cough and congestion that have been ongoing for the past month.  Initially cough was productive with a yellow phlegm, cough has not been productive over the past few weeks.  Has not had any fevers.  Has mild sore throat.  Denies rhinorrhea.  Does have a history of asthma.  Does not currently have an albuterol inhaler.  Has not had any wheezing or shortness of breath throughout his illness.  He took Mucinex when his symptoms first started, is not currently taking any medications.  Hx of asthma, schizophrenia, seizures and SI.  Medical history of elevated A1c in the past, most recent A1c in January was within normal limits.  The history is provided by the patient and medical records.  Cough   Past Medical History:  Diagnosis Date   Asthma    Diarrhea due to drug    Healthcare maintenance 02/04/2019   Intertrigo 02/04/2019   Localized swelling, mass and lump, neck 08/26/2019   Pilonidal cyst without abscess 09/08/2021   Schizophrenia (HCC)    Seizures (HCC)    Suicidal ideation     Patient Active Problem List   Diagnosis Date Noted   Perineal abscess 05/29/2023   Elevated hemoglobin A1c 05/29/2023   Viral conjunctivitis 09/18/2022   Asthma    Schizophrenia (HCC)     History reviewed. No pertinent surgical history.     Home Medications    Prior to Admission medications   Medication Sig Start Date End Date Taking? Authorizing Provider  benzonatate (TESSALON) 200 MG capsule Take 1 capsule (200 mg total) by mouth every 8 (eight) hours. 08/16/23  Yes Rinaldo Ratel, [place] N, FNP  lithium carbonate 150 MG capsule Take by mouth. 07/19/23  Yes [provider]  albuterol (VENTOLIN HFA) 108 (90 Base) MCG/ACT inhaler Inhale  1-2 puffs into the lungs every 6 (six) hours as needed for wheezing or shortness of breath. 10/06/18   Mardella Layman, MD  amoxicillin-clavulanate (AUGMENTIN) 875-125 MG tablet Take 1 tablet by mouth 2 (two) times daily. 05/29/23   Dameron, Nolberto Hanlon, DO  carboxymethylcellulose (LUBRICANT EYE DROPS) 0.5 % SOLN Place 1 drop into the left eye 3 (three) times daily as needed. 09/16/22   Valetta Close, MD  cloZAPine (CLOZARIL) 100 MG tablet Take 1.5 tablets (150 mg total) by mouth 2 (two) times daily. 09/27/19   Starkes-Perry, Juel Burrow, FNP  diclofenac Sodium (VOLTAREN) 1 % GEL Apply 2 g topically 4 (four) times daily. 11/18/19   Maness, Loistine Chance, MD  lithium carbonate (ESKALITH) 450 MG CR tablet Take 900 mg by mouth at bedtime. 11/11/19   [provider]  loperamide (IMODIUM) 2 MG capsule TAKE 1 CAPSULE BY MOUTH AS NEEDED FOR FOR DIARRHEA OR LOOSE STOOLS. 08/21/19   Winfrey, Harlen Labs, MD  OLANZapine (ZYPREXA) 5 MG tablet Take 1 tablet (5 mg total) by mouth daily as needed (hallucinations). 12/07/19 12/06/20  Aldean Baker, NP  valproic acid (DEPAKENE) 250 MG capsule Take 2 capsules (500 mg total) by mouth 2 (two) times daily. 12/07/19   Aldean Baker, NP  VIIBRYD 40 MG TABS Take 40 mg by mouth daily. 09/24/19   [provider]    Family History Family History  Problem Relation Age of Onset   Diabetes Mother    Dementia Maternal Grandfather     Social History Social History   Tobacco Use   Smoking status: Never   Smokeless tobacco: Never  Vaping Use   Vaping status: Never Used  Substance Use Topics   Alcohol use: Never   Drug use: Never     Allergies   Other and Shellfish allergy   Review of Systems Review of Systems  Per HPI  Physical Exam Triage Vital Signs ED Triage Vitals  Encounter Vitals Group     BP 08/16/23 1015 120/82     Systolic BP Percentile --      Diastolic BP Percentile --      Pulse Rate 08/16/23 1015 (!) 113     Resp 08/16/23 1015 20     Temp 08/16/23  1015 98.9 F (37.2 C)     Temp Source 08/16/23 1015 Oral     SpO2 08/16/23 1015 95 %     Weight --      Height --      Head Circumference --      Peak Flow --      Pain Score 08/16/23 1011 4     Pain Loc --      Pain Education --      Exclude from Growth Chart --    No data found.  Updated Vital Signs BP 120/82 (BP Location: Right Arm) Comment (BP Location): large cuff  Pulse (!) 113   Temp 98.9 F (37.2 C) (Oral)   Resp 20   SpO2 95%   Visual Acuity Right Eye Distance:   Left Eye Distance:   Bilateral Distance:    Right Eye Near:   Left Eye Near:    Bilateral Near:     Physical Exam Vitals and nursing note reviewed.  Constitutional:      Appearance: Normal appearance.  HENT:     Head: Normocephalic and atraumatic.     Right Ear: External ear normal.     Left Ear: External ear normal.     Nose: Congestion present.     Mouth/Throat:     Mouth: Mucous membranes are moist.     Pharynx: Posterior oropharyngeal erythema present.  Eyes:     Conjunctiva/sclera: Conjunctivae normal.  Cardiovascular:     Rate and Rhythm: Normal rate and regular rhythm.     Heart sounds: Normal heart sounds. No murmur heard. Pulmonary:     Effort: Pulmonary effort is normal. No respiratory distress.     Breath sounds: Normal breath sounds.  Skin:    General: Skin is warm and dry.  Neurological:     General: No focal deficit present.     Mental Status: He is alert.  Psychiatric:        Mood and Affect: Mood normal.      UC Treatments / Results  Labs (all labs ordered are listed, but only abnormal results are displayed) Labs Reviewed - No data to display  EKG   Radiology No results found.  Procedures Procedures (including critical care time)  Medications Ordered in UC Medications  dexamethasone (DECADRON) injection 10 mg (has no administration in time range)    Initial Impression / Assessment and Plan / UC Course  I have reviewed the triage vital signs and the  nursing notes.  Pertinent labs & imaging results that were available during my care of the patient were reviewed by me and considered in my medical decision making (  see chart for details).  Vitals in triage reviewed, patient is hemodynamically stable.  Lungs are vesicular, heart regular rate and rhythm.  Congestion patient still drip on physical exam.  Chest x-ray does not show any obvious infiltrate per my interpretation.  Has been afebrile, without shortness of breath or wheezing.  Will give IM steroid in clinic to help with cough over suspicion for bronchitis, this may help if there is any asthma component to his symptoms as well.  Symptomatic management for cough discussed.  Plan of care, follow-up care return precautions given, no questions at this time.     Final Clinical Impressions(s) / UC Diagnoses   Final diagnoses:  Acute cough  Acute bronchitis, unspecified organism     Discharge Instructions      I do not see any pneumonia on your chest x-ray, the official radiology read will be back in the next few hours and you will be contacted if we need to update your treatment plan.  Steroid injection will help reduce inflammation.  You can take the Tessalon Perles every 8 hours as needed for cough.  Mucinex 1200 mg daily can help loosen up any secretions.  Same with the humidifier may help as well.  Symptoms should improve over the next few weeks, if you develop any new concerning symptoms follow-up with your primary care provider or return to clinic.    ED Prescriptions     Medication Sig Dispense Auth. Provider   benzonatate (TESSALON) 200 MG capsule Take 1 capsule (200 mg total) by mouth every 8 (eight) hours. 30 capsule Harlow Lighter, Audreana Hancox  N, FNP      PDMP not reviewed this encounter.   Rocky Cipro, FNP 08/16/23 1050
